# Patient Record
Sex: Female | Born: 1975 | Hispanic: Yes | Marital: Married | Smoking: Never smoker
Health system: Southern US, Community
[De-identification: ages and names within clinical notes are randomized; demographics above are authoritative.]

## PROBLEM LIST (undated history)

## (undated) ENCOUNTER — Inpatient Hospital Stay (HOSPITAL_COMMUNITY): Payer: Self-pay

## (undated) DIAGNOSIS — G43909 Migraine, unspecified, not intractable, without status migrainosus: Secondary | ICD-10-CM

## (undated) DIAGNOSIS — I10 Essential (primary) hypertension: Secondary | ICD-10-CM

## (undated) DIAGNOSIS — R87619 Unspecified abnormal cytological findings in specimens from cervix uteri: Secondary | ICD-10-CM

## (undated) DIAGNOSIS — IMO0002 Reserved for concepts with insufficient information to code with codable children: Secondary | ICD-10-CM

## (undated) HISTORY — PX: NO PAST SURGERIES: SHX2092

## (undated) HISTORY — DX: Unspecified abnormal cytological findings in specimens from cervix uteri: R87.619

## (undated) HISTORY — DX: Reserved for concepts with insufficient information to code with codable children: IMO0002

## (undated) HISTORY — DX: Migraine, unspecified, not intractable, without status migrainosus: G43.909

---

## 2000-10-31 ENCOUNTER — Encounter: Payer: Self-pay | Admitting: Emergency Medicine

## 2000-10-31 ENCOUNTER — Emergency Department (HOSPITAL_COMMUNITY): Admission: EM | Admit: 2000-10-31 | Discharge: 2000-10-31 | Payer: Self-pay | Admitting: Emergency Medicine

## 2001-07-02 ENCOUNTER — Ambulatory Visit (HOSPITAL_COMMUNITY): Admission: RE | Admit: 2001-07-02 | Discharge: 2001-07-02 | Payer: Self-pay | Admitting: *Deleted

## 2001-09-25 ENCOUNTER — Encounter: Admission: RE | Admit: 2001-09-25 | Discharge: 2001-09-25 | Payer: Self-pay | Admitting: Obstetrics and Gynecology

## 2001-09-30 ENCOUNTER — Ambulatory Visit: Admission: RE | Admit: 2001-09-30 | Discharge: 2001-09-30 | Payer: Self-pay | Admitting: Gynecologic Oncology

## 2001-10-20 ENCOUNTER — Inpatient Hospital Stay (HOSPITAL_COMMUNITY): Admission: AD | Admit: 2001-10-20 | Discharge: 2001-10-20 | Payer: Self-pay

## 2001-10-30 ENCOUNTER — Inpatient Hospital Stay (HOSPITAL_COMMUNITY): Admission: RE | Admit: 2001-10-30 | Discharge: 2001-10-30 | Payer: Self-pay | Admitting: *Deleted

## 2001-11-03 ENCOUNTER — Encounter (HOSPITAL_COMMUNITY): Admission: RE | Admit: 2001-11-03 | Discharge: 2001-11-17 | Payer: Self-pay | Admitting: *Deleted

## 2001-11-14 ENCOUNTER — Other Ambulatory Visit: Admission: RE | Admit: 2001-11-14 | Discharge: 2001-11-14 | Payer: Self-pay | Admitting: *Deleted

## 2001-11-17 ENCOUNTER — Encounter (INDEPENDENT_AMBULATORY_CARE_PROVIDER_SITE_OTHER): Payer: Self-pay | Admitting: *Deleted

## 2001-11-17 ENCOUNTER — Inpatient Hospital Stay (HOSPITAL_COMMUNITY): Admission: AD | Admit: 2001-11-17 | Discharge: 2001-11-20 | Payer: Self-pay | Admitting: *Deleted

## 2002-06-27 ENCOUNTER — Emergency Department (HOSPITAL_COMMUNITY): Admission: EM | Admit: 2002-06-27 | Discharge: 2002-06-27 | Payer: Self-pay | Admitting: Emergency Medicine

## 2002-10-27 ENCOUNTER — Emergency Department (HOSPITAL_COMMUNITY): Admission: EM | Admit: 2002-10-27 | Discharge: 2002-10-27 | Payer: Self-pay | Admitting: Emergency Medicine

## 2003-01-09 ENCOUNTER — Emergency Department (HOSPITAL_COMMUNITY): Admission: EM | Admit: 2003-01-09 | Discharge: 2003-01-09 | Payer: Self-pay | Admitting: Emergency Medicine

## 2003-03-30 ENCOUNTER — Other Ambulatory Visit: Admission: RE | Admit: 2003-03-30 | Discharge: 2003-03-30 | Payer: Self-pay | Admitting: *Deleted

## 2003-03-30 ENCOUNTER — Encounter (INDEPENDENT_AMBULATORY_CARE_PROVIDER_SITE_OTHER): Payer: Self-pay | Admitting: *Deleted

## 2003-03-30 ENCOUNTER — Encounter: Admission: RE | Admit: 2003-03-30 | Discharge: 2003-03-30 | Payer: Self-pay | Admitting: Obstetrics and Gynecology

## 2003-04-20 ENCOUNTER — Encounter: Admission: RE | Admit: 2003-04-20 | Discharge: 2003-04-20 | Payer: Self-pay | Admitting: Obstetrics and Gynecology

## 2003-05-08 HISTORY — PX: LEEP: SHX91

## 2003-08-15 ENCOUNTER — Emergency Department (HOSPITAL_COMMUNITY): Admission: EM | Admit: 2003-08-15 | Discharge: 2003-08-15 | Payer: Self-pay | Admitting: Emergency Medicine

## 2003-09-07 ENCOUNTER — Encounter (INDEPENDENT_AMBULATORY_CARE_PROVIDER_SITE_OTHER): Payer: Self-pay | Admitting: *Deleted

## 2003-09-07 ENCOUNTER — Encounter: Admission: RE | Admit: 2003-09-07 | Discharge: 2003-09-07 | Payer: Self-pay | Admitting: Obstetrics and Gynecology

## 2003-09-07 ENCOUNTER — Other Ambulatory Visit: Admission: RE | Admit: 2003-09-07 | Discharge: 2003-09-07 | Payer: Self-pay | Admitting: Obstetrics and Gynecology

## 2003-10-05 ENCOUNTER — Encounter: Admission: RE | Admit: 2003-10-05 | Discharge: 2003-10-05 | Payer: Self-pay | Admitting: Obstetrics and Gynecology

## 2003-12-07 ENCOUNTER — Encounter: Admission: RE | Admit: 2003-12-07 | Discharge: 2003-12-07 | Payer: Self-pay | Admitting: Obstetrics and Gynecology

## 2003-12-07 ENCOUNTER — Other Ambulatory Visit: Admission: RE | Admit: 2003-12-07 | Discharge: 2003-12-07 | Payer: Self-pay | Admitting: Obstetrics and Gynecology

## 2003-12-07 ENCOUNTER — Encounter (INDEPENDENT_AMBULATORY_CARE_PROVIDER_SITE_OTHER): Payer: Self-pay | Admitting: Specialist

## 2003-12-13 ENCOUNTER — Inpatient Hospital Stay (HOSPITAL_COMMUNITY): Admission: AD | Admit: 2003-12-13 | Discharge: 2003-12-13 | Payer: Self-pay | Admitting: Obstetrics and Gynecology

## 2003-12-21 ENCOUNTER — Encounter: Admission: RE | Admit: 2003-12-21 | Discharge: 2003-12-21 | Payer: Self-pay | Admitting: Obstetrics and Gynecology

## 2004-01-02 ENCOUNTER — Emergency Department (HOSPITAL_COMMUNITY): Admission: EM | Admit: 2004-01-02 | Discharge: 2004-01-02 | Payer: Self-pay | Admitting: *Deleted

## 2004-02-20 ENCOUNTER — Emergency Department (HOSPITAL_COMMUNITY): Admission: EM | Admit: 2004-02-20 | Discharge: 2004-02-20 | Payer: Self-pay | Admitting: Emergency Medicine

## 2004-04-06 ENCOUNTER — Ambulatory Visit: Payer: Self-pay | Admitting: Family Medicine

## 2004-04-09 ENCOUNTER — Emergency Department (HOSPITAL_COMMUNITY): Admission: EM | Admit: 2004-04-09 | Discharge: 2004-04-09 | Payer: Self-pay | Admitting: Emergency Medicine

## 2004-07-05 ENCOUNTER — Emergency Department (HOSPITAL_COMMUNITY): Admission: EM | Admit: 2004-07-05 | Discharge: 2004-07-05 | Payer: Self-pay | Admitting: Emergency Medicine

## 2004-08-10 ENCOUNTER — Ambulatory Visit: Payer: Self-pay | Admitting: Family Medicine

## 2004-08-10 ENCOUNTER — Encounter: Payer: Self-pay | Admitting: Family Medicine

## 2004-12-14 ENCOUNTER — Ambulatory Visit: Payer: Self-pay | Admitting: Obstetrics and Gynecology

## 2005-08-31 ENCOUNTER — Encounter (INDEPENDENT_AMBULATORY_CARE_PROVIDER_SITE_OTHER): Payer: Self-pay | Admitting: Gynecology

## 2005-08-31 ENCOUNTER — Ambulatory Visit: Payer: Self-pay | Admitting: Gynecology

## 2005-09-22 ENCOUNTER — Emergency Department (HOSPITAL_COMMUNITY): Admission: EM | Admit: 2005-09-22 | Discharge: 2005-09-22 | Payer: Self-pay | Admitting: Emergency Medicine

## 2006-08-21 ENCOUNTER — Encounter: Payer: Self-pay | Admitting: Obstetrics and Gynecology

## 2006-08-21 ENCOUNTER — Ambulatory Visit: Payer: Self-pay | Admitting: Obstetrics and Gynecology

## 2007-02-15 ENCOUNTER — Emergency Department (HOSPITAL_COMMUNITY): Admission: EM | Admit: 2007-02-15 | Discharge: 2007-02-16 | Payer: Self-pay | Admitting: Emergency Medicine

## 2007-08-22 ENCOUNTER — Ambulatory Visit: Payer: Self-pay | Admitting: Obstetrics & Gynecology

## 2007-08-22 ENCOUNTER — Encounter: Payer: Self-pay | Admitting: Obstetrics & Gynecology

## 2007-12-03 ENCOUNTER — Ambulatory Visit: Payer: Self-pay | Admitting: *Deleted

## 2007-12-03 ENCOUNTER — Ambulatory Visit (HOSPITAL_COMMUNITY): Admission: RE | Admit: 2007-12-03 | Discharge: 2007-12-03 | Payer: Self-pay | Admitting: Obstetrics & Gynecology

## 2007-12-22 ENCOUNTER — Inpatient Hospital Stay (HOSPITAL_COMMUNITY): Admission: AD | Admit: 2007-12-22 | Discharge: 2007-12-22 | Payer: Self-pay | Admitting: Family Medicine

## 2007-12-22 ENCOUNTER — Ambulatory Visit: Payer: Self-pay | Admitting: Family Medicine

## 2007-12-30 ENCOUNTER — Emergency Department (HOSPITAL_COMMUNITY): Admission: EM | Admit: 2007-12-30 | Discharge: 2007-12-30 | Payer: Self-pay | Admitting: Emergency Medicine

## 2007-12-31 ENCOUNTER — Ambulatory Visit: Payer: Self-pay | Admitting: Obstetrics & Gynecology

## 2008-01-08 ENCOUNTER — Ambulatory Visit: Payer: Self-pay | Admitting: Nurse Practitioner

## 2008-01-14 ENCOUNTER — Ambulatory Visit (HOSPITAL_COMMUNITY): Admission: RE | Admit: 2008-01-14 | Discharge: 2008-01-14 | Payer: Self-pay | Admitting: Obstetrics and Gynecology

## 2008-01-20 ENCOUNTER — Ambulatory Visit (HOSPITAL_COMMUNITY): Admission: RE | Admit: 2008-01-20 | Discharge: 2008-01-20 | Payer: Self-pay | Admitting: Family Medicine

## 2008-01-28 ENCOUNTER — Ambulatory Visit: Payer: Self-pay | Admitting: Obstetrics & Gynecology

## 2008-02-10 ENCOUNTER — Ambulatory Visit (HOSPITAL_COMMUNITY): Admission: RE | Admit: 2008-02-10 | Discharge: 2008-02-10 | Payer: Self-pay | Admitting: Family Medicine

## 2008-02-25 ENCOUNTER — Ambulatory Visit: Payer: Self-pay | Admitting: Obstetrics & Gynecology

## 2008-02-28 ENCOUNTER — Inpatient Hospital Stay (HOSPITAL_COMMUNITY): Admission: AD | Admit: 2008-02-28 | Discharge: 2008-02-28 | Payer: Self-pay | Admitting: Family Medicine

## 2008-03-02 ENCOUNTER — Ambulatory Visit (HOSPITAL_COMMUNITY): Admission: RE | Admit: 2008-03-02 | Discharge: 2008-03-02 | Payer: Self-pay | Admitting: Family Medicine

## 2008-03-19 ENCOUNTER — Ambulatory Visit (HOSPITAL_COMMUNITY): Admission: RE | Admit: 2008-03-19 | Discharge: 2008-03-19 | Payer: Self-pay | Admitting: Family Medicine

## 2008-03-24 ENCOUNTER — Ambulatory Visit: Payer: Self-pay | Admitting: Obstetrics & Gynecology

## 2008-04-13 ENCOUNTER — Ambulatory Visit: Payer: Self-pay | Admitting: Obstetrics and Gynecology

## 2008-04-13 ENCOUNTER — Inpatient Hospital Stay (HOSPITAL_COMMUNITY): Admission: AD | Admit: 2008-04-13 | Discharge: 2008-04-13 | Payer: Self-pay | Admitting: Family Medicine

## 2008-04-21 ENCOUNTER — Ambulatory Visit: Payer: Self-pay | Admitting: Obstetrics & Gynecology

## 2008-05-05 ENCOUNTER — Ambulatory Visit: Payer: Self-pay | Admitting: Obstetrics & Gynecology

## 2008-05-05 ENCOUNTER — Encounter: Payer: Self-pay | Admitting: Obstetrics & Gynecology

## 2008-05-05 LAB — CONVERTED CEMR LAB
HCT: 34.9 % — ABNORMAL LOW (ref 36.0–46.0)
Hemoglobin: 11.5 g/dL — ABNORMAL LOW (ref 12.0–15.0)
Platelets: 234 10*3/uL (ref 150–400)
WBC: 6.9 10*3/uL (ref 4.0–10.5)

## 2008-05-26 ENCOUNTER — Ambulatory Visit: Payer: Self-pay | Admitting: Family Medicine

## 2008-05-31 ENCOUNTER — Ambulatory Visit (HOSPITAL_COMMUNITY): Admission: RE | Admit: 2008-05-31 | Discharge: 2008-05-31 | Payer: Self-pay | Admitting: Family Medicine

## 2008-06-02 ENCOUNTER — Inpatient Hospital Stay (HOSPITAL_COMMUNITY): Admission: AD | Admit: 2008-06-02 | Discharge: 2008-06-02 | Payer: Self-pay | Admitting: Family Medicine

## 2008-06-09 ENCOUNTER — Ambulatory Visit: Payer: Self-pay | Admitting: Family Medicine

## 2008-06-23 ENCOUNTER — Ambulatory Visit: Payer: Self-pay | Admitting: Obstetrics & Gynecology

## 2008-06-30 ENCOUNTER — Ambulatory Visit: Payer: Self-pay | Admitting: Family Medicine

## 2008-07-01 ENCOUNTER — Encounter: Payer: Self-pay | Admitting: Obstetrics & Gynecology

## 2008-07-01 LAB — CONVERTED CEMR LAB: GC Probe Amp, Genital: NEGATIVE

## 2008-07-07 ENCOUNTER — Ambulatory Visit: Payer: Self-pay | Admitting: Obstetrics & Gynecology

## 2008-07-10 ENCOUNTER — Inpatient Hospital Stay (HOSPITAL_COMMUNITY): Admission: AD | Admit: 2008-07-10 | Discharge: 2008-07-12 | Payer: Self-pay | Admitting: Obstetrics and Gynecology

## 2008-07-10 ENCOUNTER — Encounter: Payer: Self-pay | Admitting: Obstetrics and Gynecology

## 2008-07-10 ENCOUNTER — Ambulatory Visit: Payer: Self-pay | Admitting: Advanced Practice Midwife

## 2008-07-16 ENCOUNTER — Ambulatory Visit: Payer: Self-pay | Admitting: Family Medicine

## 2008-07-16 ENCOUNTER — Inpatient Hospital Stay (HOSPITAL_COMMUNITY): Admission: AD | Admit: 2008-07-16 | Discharge: 2008-07-16 | Payer: Self-pay | Admitting: Obstetrics & Gynecology

## 2008-07-17 ENCOUNTER — Ambulatory Visit: Payer: Self-pay | Admitting: Obstetrics and Gynecology

## 2008-07-17 ENCOUNTER — Inpatient Hospital Stay (HOSPITAL_COMMUNITY): Admission: AD | Admit: 2008-07-17 | Discharge: 2008-07-17 | Payer: Self-pay | Admitting: Obstetrics & Gynecology

## 2008-11-29 ENCOUNTER — Emergency Department (HOSPITAL_COMMUNITY): Admission: EM | Admit: 2008-11-29 | Discharge: 2008-11-29 | Payer: Self-pay | Admitting: Internal Medicine

## 2009-04-12 ENCOUNTER — Emergency Department (HOSPITAL_COMMUNITY): Admission: EM | Admit: 2009-04-12 | Discharge: 2009-04-13 | Payer: Self-pay | Admitting: Emergency Medicine

## 2009-10-30 IMAGING — US US OB FOLLOW-UP
1 series · 14 of 28 positions shown · non-contrast
Comparison: none

OBSTETRICAL ULTRASOUND:
 This ultrasound exam was performed in the [HOSPITAL] Ultrasound Department.  The OB US report was generated in the AS system, and faxed to the ordering physician.  This report is also available in [REDACTED] PACS.

[Series 1: us ob follow up / re-eval · 14 of 28 slices shown]
[im 2/28]
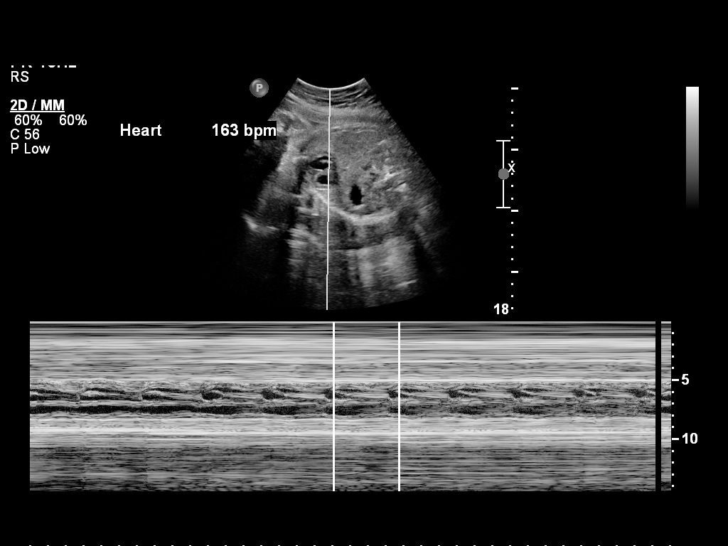
[im 4/28]
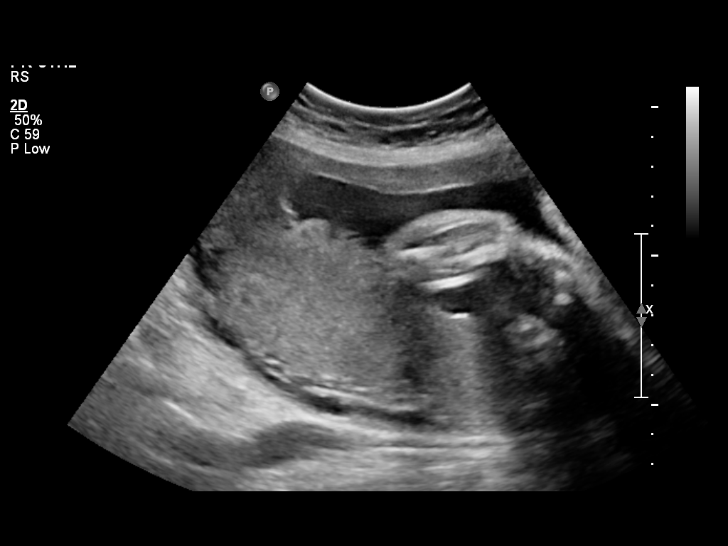
[im 6/28]
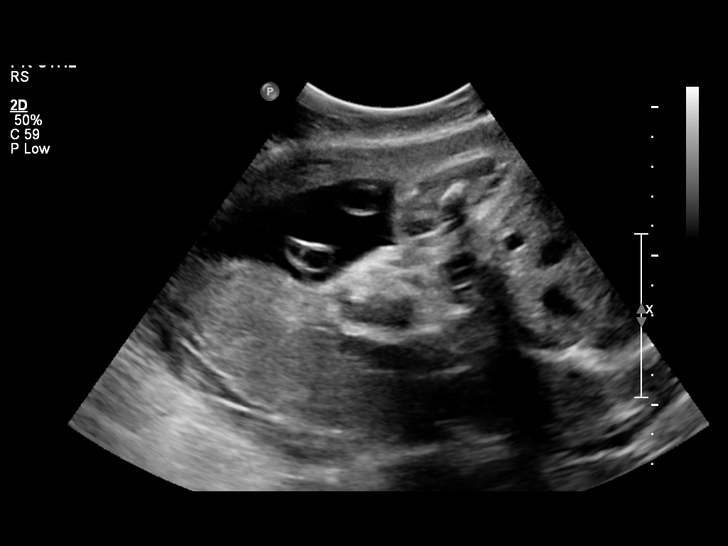
[im 8/28]
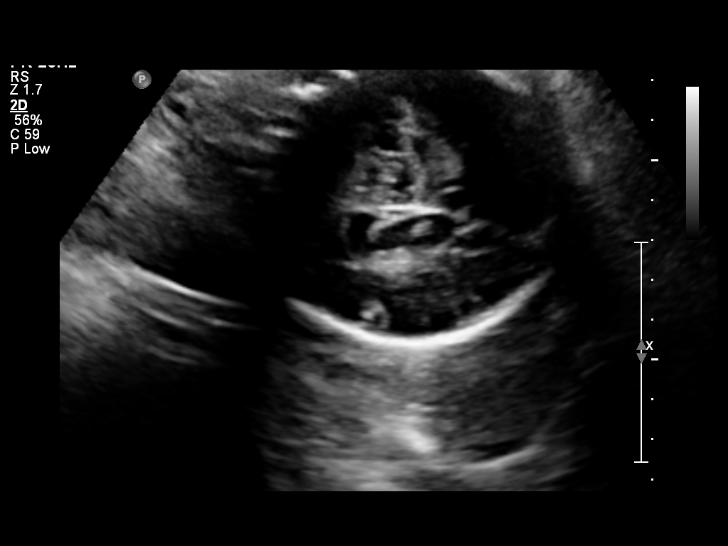
[im 10/28]
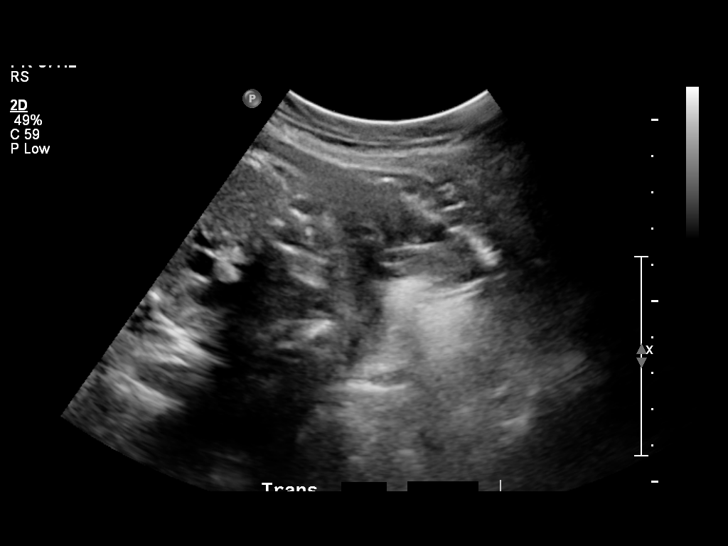
[im 12/28]
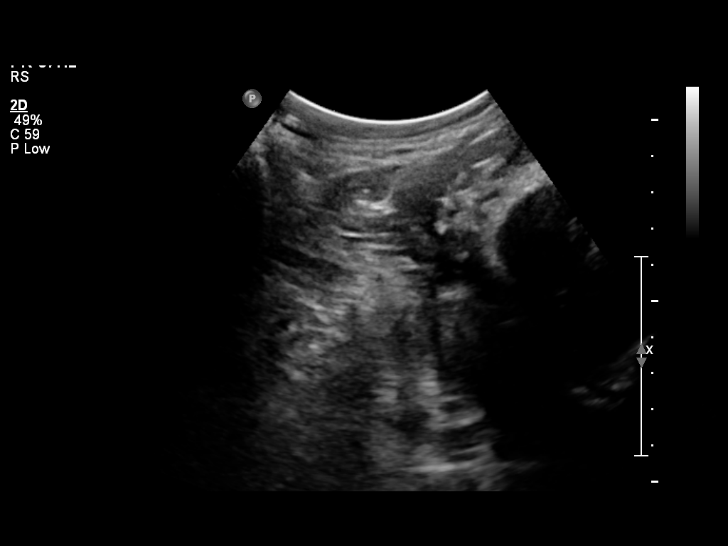
[im 14/28]
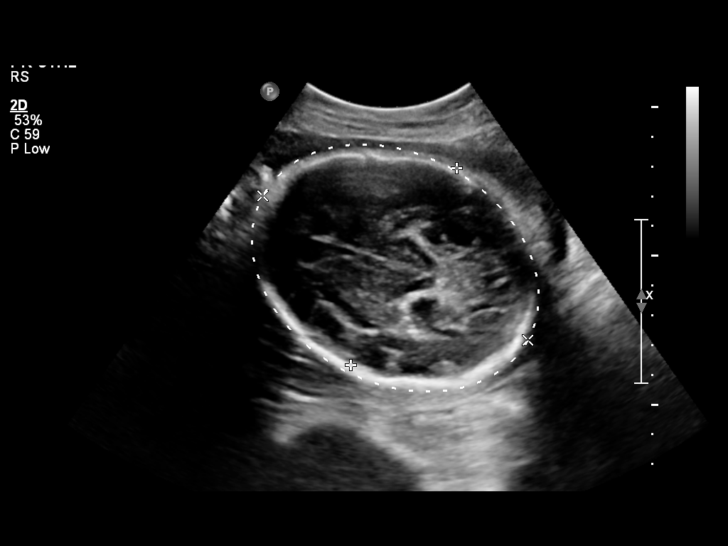
[im 16/28]
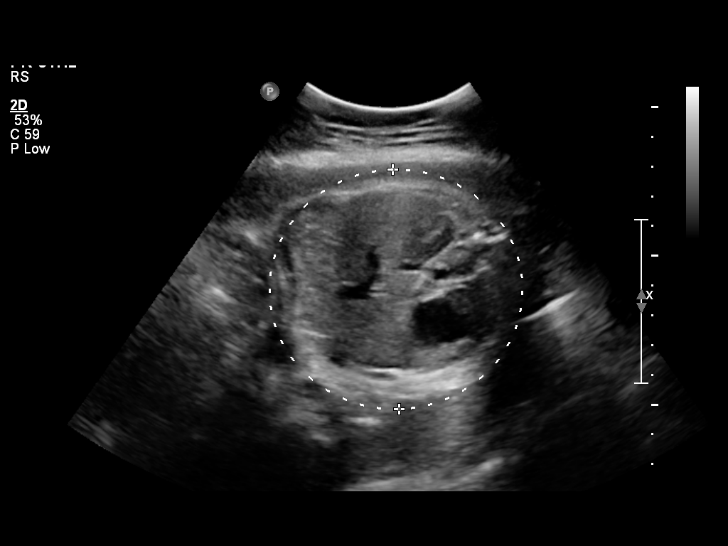
[im 18/28]
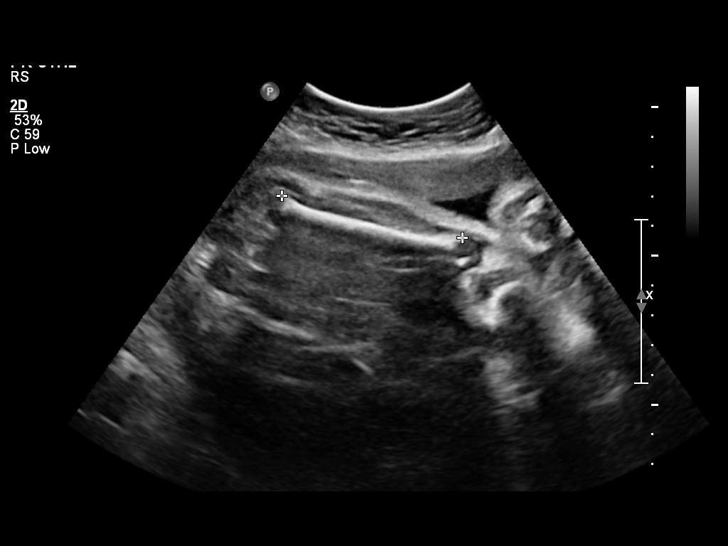
[im 20/28]
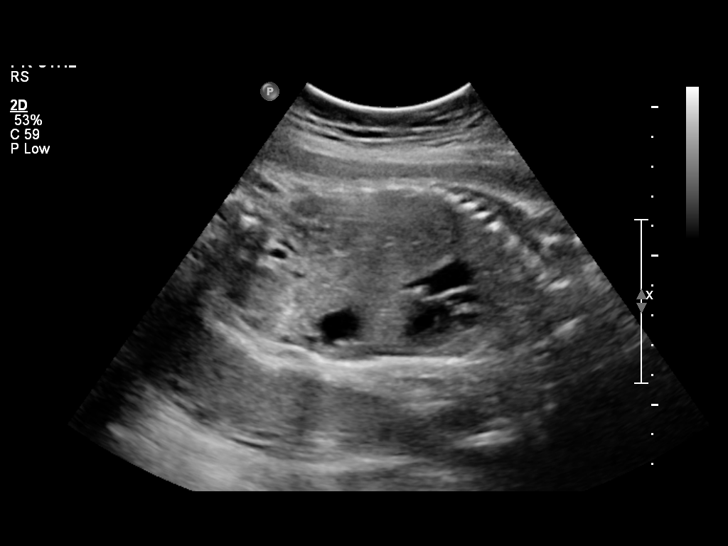
[im 22/28]
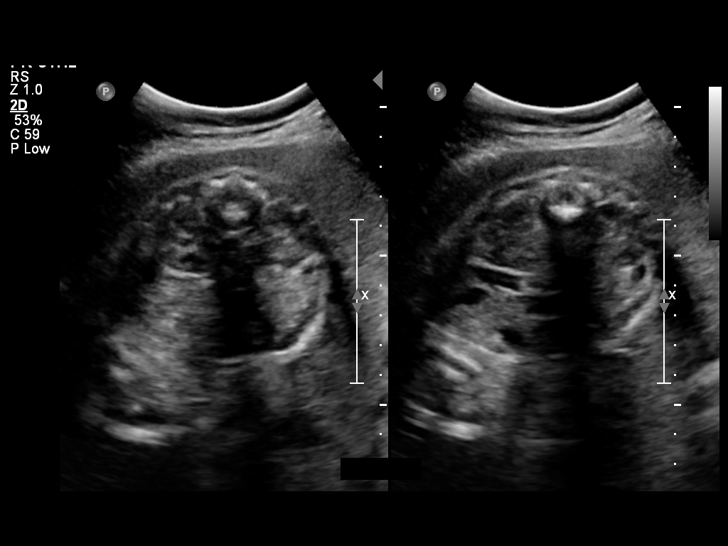
[im 24/28]
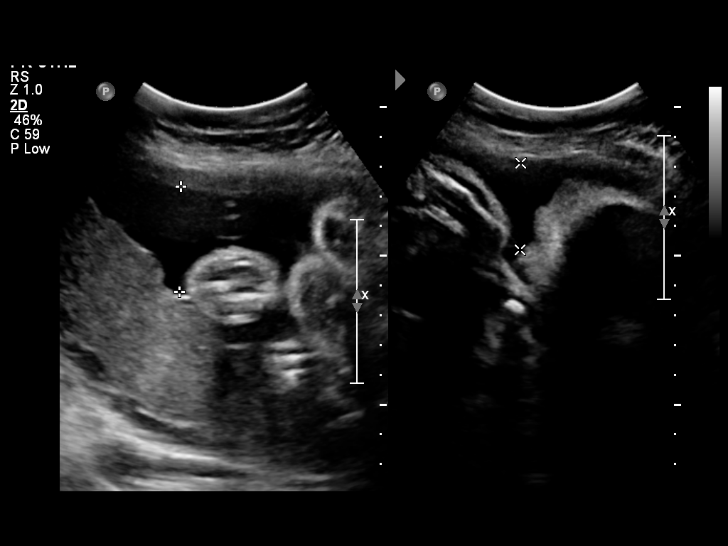
[im 26/28]
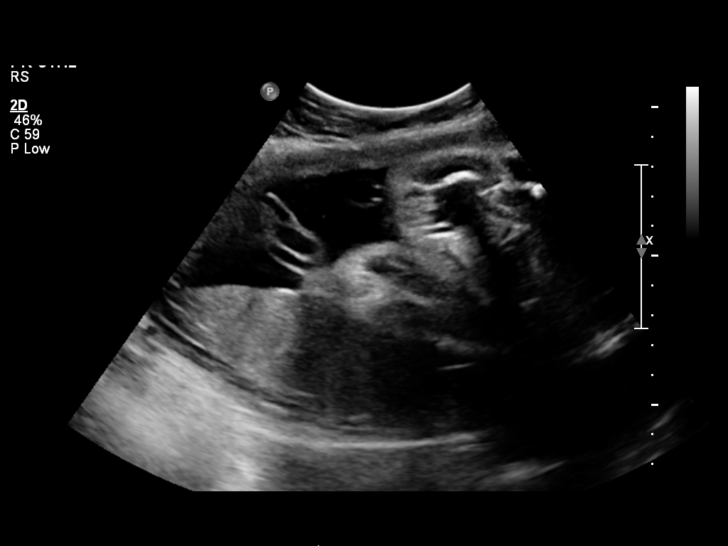
[im 28/28]
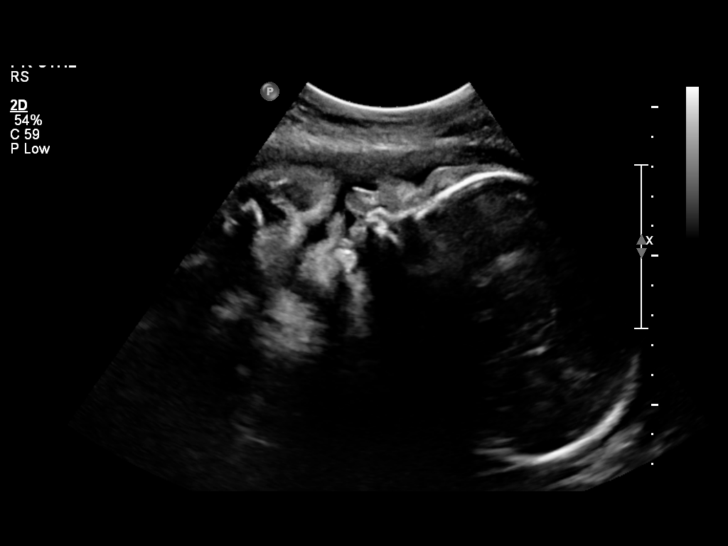

[14 of 28 positions shown; findings below may reference images not displayed]

IMPRESSION: See AS Obstetric US report.

## 2009-12-16 ENCOUNTER — Emergency Department (HOSPITAL_COMMUNITY): Admission: EM | Admit: 2009-12-16 | Discharge: 2009-12-17 | Payer: Self-pay | Admitting: Emergency Medicine

## 2010-08-08 LAB — URINALYSIS, ROUTINE W REFLEX MICROSCOPIC
Ketones, ur: NEGATIVE mg/dL
Nitrite: NEGATIVE
Specific Gravity, Urine: 1.011 (ref 1.005–1.030)

## 2010-08-08 LAB — URINE CULTURE
Colony Count: NO GROWTH
Culture: NO GROWTH

## 2010-08-08 LAB — CBC
HCT: 37.3 % (ref 36.0–46.0)
Hemoglobin: 12.4 g/dL (ref 12.0–15.0)
Platelets: 223 10*3/uL (ref 150–400)
RDW: 12.6 % (ref 11.5–15.5)

## 2010-08-08 LAB — CULTURE, BLOOD (ROUTINE X 2): Culture: NO GROWTH

## 2010-08-08 LAB — COMPREHENSIVE METABOLIC PANEL
AST: 19 U/L (ref 0–37)
Albumin: 3.3 g/dL — ABNORMAL LOW (ref 3.5–5.2)
GFR calc Af Amer: >60 mL/min (ref >60)
GFR calc non Af Amer: >60 mL/min (ref >60)
Potassium: 3.2 mEq/L — ABNORMAL LOW (ref 3.5–5.1)
Total Bilirubin: 0.2 mg/dL — ABNORMAL LOW (ref 0.3–1.2)

## 2010-08-08 LAB — DIFFERENTIAL
Eosinophils Absolute: 0 10*3/uL (ref 0.0–0.7)
Eosinophils Relative: 0 % (ref 0–5)
Lymphs Abs: 0.8 10*3/uL (ref 0.7–4.0)
Monocytes Absolute: 0.3 10*3/uL (ref 0.1–1.0)
Monocytes Relative: 4 % (ref 3–12)
Neutro Abs: 6 10*3/uL (ref 1.7–7.7)
Neutrophils Relative %: 85 % — ABNORMAL HIGH (ref 43–77)

## 2010-08-17 LAB — POCT URINALYSIS DIP (DEVICE)
Glucose, UA: NEGATIVE mg/dL
Nitrite: NEGATIVE
Protein, ur: NEGATIVE mg/dL
Specific Gravity, Urine: 1.02 (ref 1.005–1.030)
Urobilinogen, UA: 0.2 mg/dL (ref 0.0–1.0)

## 2010-08-17 LAB — BASIC METABOLIC PANEL
BUN: 9 mg/dL (ref 6–23)
Creatinine, Ser: 0.61 mg/dL (ref 0.4–1.2)
GFR calc Af Amer: >60 mL/min (ref >60)
GFR calc non Af Amer: >60 mL/min (ref >60)
Potassium: 3.9 mEq/L (ref 3.5–5.1)

## 2010-08-17 LAB — CBC
MCV: 95.5 fL (ref 78.0–100.0)
Platelets: 250 10*3/uL (ref 150–400)
Platelets: 290 10*3/uL (ref 150–400)
RBC: 3.6 MIL/uL — ABNORMAL LOW (ref 3.87–5.11)
WBC: 3.7 10*3/uL — ABNORMAL LOW (ref 4.0–10.5)
WBC: 9.9 10*3/uL (ref 4.0–10.5)

## 2010-08-17 LAB — DIFFERENTIAL
Basophils Absolute: 0 10*3/uL (ref 0.0–0.1)
Basophils Relative: 0 % (ref 0–1)
Eosinophils Absolute: 0 10*3/uL (ref 0.0–0.7)
Eosinophils Relative: 1 % (ref 0–5)
Lymphocytes Relative: 8 % — ABNORMAL LOW (ref 12–46)
Lymphs Abs: 0.3 10*3/uL — ABNORMAL LOW (ref 0.7–4.0)
Neutrophils Relative %: 82 % — ABNORMAL HIGH (ref 43–77)

## 2010-08-17 LAB — URINALYSIS, ROUTINE W REFLEX MICROSCOPIC
Bilirubin Urine: NEGATIVE
Ketones, ur: NEGATIVE mg/dL
Nitrite: NEGATIVE
Protein, ur: NEGATIVE mg/dL
Urobilinogen, UA: 0.2 mg/dL (ref 0.0–1.0)

## 2010-08-17 LAB — RPR: RPR Ser Ql: NONREACTIVE

## 2010-08-21 LAB — FETAL FIBRONECTIN: Fetal Fibronectin: NEGATIVE

## 2010-08-21 LAB — WET PREP, GENITAL
Trich, Wet Prep: NONE SEEN
Yeast Wet Prep HPF POC: NONE SEEN

## 2010-08-21 LAB — DIFFERENTIAL
Eosinophils Absolute: 0.1 10*3/uL (ref 0.0–0.7)
Eosinophils Relative: 1 % (ref 0–5)
Lymphocytes Relative: 18 % (ref 12–46)
Lymphs Abs: 1.1 10*3/uL (ref 0.7–4.0)
Monocytes Absolute: 0.4 10*3/uL (ref 0.1–1.0)

## 2010-08-21 LAB — URINALYSIS, ROUTINE W REFLEX MICROSCOPIC
Glucose, UA: NEGATIVE mg/dL
Hgb urine dipstick: NEGATIVE
Ketones, ur: NEGATIVE mg/dL
Protein, ur: NEGATIVE mg/dL
pH: 5.5 (ref 5.0–8.0)

## 2010-08-21 LAB — POCT URINALYSIS DIP (DEVICE)
Hgb urine dipstick: NEGATIVE
Ketones, ur: NEGATIVE mg/dL
Protein, ur: 30 mg/dL — AB
Specific Gravity, Urine: 1.015 (ref 1.005–1.030)
Urobilinogen, UA: 0.2 mg/dL (ref 0.0–1.0)

## 2010-08-21 LAB — CBC
HCT: 33 % — ABNORMAL LOW (ref 36.0–46.0)
Hemoglobin: 11.3 g/dL — ABNORMAL LOW (ref 12.0–15.0)
MCV: 94.8 fL (ref 78.0–100.0)
Platelets: 217 10*3/uL (ref 150–400)
RDW: 13.2 % (ref 11.5–15.5)
WBC: 6 10*3/uL (ref 4.0–10.5)

## 2010-08-21 LAB — STREP B DNA PROBE

## 2010-08-22 LAB — POCT URINALYSIS DIP (DEVICE)
Bilirubin Urine: NEGATIVE
Glucose, UA: NEGATIVE mg/dL
Hgb urine dipstick: NEGATIVE
Hgb urine dipstick: NEGATIVE
Hgb urine dipstick: NEGATIVE
Ketones, ur: NEGATIVE mg/dL
Nitrite: NEGATIVE
Protein, ur: NEGATIVE mg/dL
Protein, ur: NEGATIVE mg/dL
Protein, ur: NEGATIVE mg/dL
Specific Gravity, Urine: 1.01 (ref 1.005–1.030)
Specific Gravity, Urine: 1.015 (ref 1.005–1.030)
Urobilinogen, UA: 0.2 mg/dL (ref 0.0–1.0)
Urobilinogen, UA: 0.2 mg/dL (ref 0.0–1.0)
pH: 7 (ref 5.0–8.0)
pH: 5.5 (ref 5.0–8.0)
pH: 8 (ref 5.0–8.0)

## 2010-09-19 NOTE — Group Therapy Note (Signed)
NAME:  [name], [name] NO.:  [id number]   MEDICAL RECORD NO.:  [id number]          PATIENT TYPE:  WOC   LOCATION[facility]                   FACILITY:  [facility]   PHYSICIAN:  Elsie Lincoln, MD      DATE OF BIRTH:  [birth year]   DATE OF SERVICE:  08/22/2007                                  CLINIC NOTE   The patient is a 35 year old gravida 1, para 1, female, LMP August 13, 2007, who presents for annual examination.  The patient has a history of  a LEEP for high grade back in 2005.  She has had normal Pap smears  since.  The patient uses condoms for birth control and would like to get  pregnant in the future.  We talked about going on prenatal vitamins  prior to becoming pregnant.   PAST MEDICAL HISTORY:  Palpitations on propranolol.   PAST SURGICAL HISTORY:  LEEP.   ALLERGIES:  No known drug allergies.   MEDICATIONS:  1. Advil p.r.n.  2. Propranolol.   REVIEW OF SYSTEMS:  Negative.   PHYSICAL EXAMINATION:  VITAL SIGNS:  Temperature 97.8, pulse 84, blood  pressure 107/73, weight 117.4 pounds.  GENERAL:  Well-nourished, well-developed, in no apparent distress.  HEENT:  Normocephalic and atraumatic.  LUNGS:  Clear to auscultation bilaterally.  HEART:  Regular rate and rhythm.  NECK:  Supple with no masses.  No thyromegaly.  BREASTS:  Nontender.  No masses, no nipple discharge, and no  lymphadenopathy.  ABDOMEN:  Soft, nontender, no organomegaly and no hernia.  GENITOURINARY:  Vagina pink normal rugae.  Cervix extremely limited  cervix present in the vaginal vault.  This is most likely secondary to  her LEEP.  Uterus anteverted and nontender.  Adnexa with no masses and  nontender.  RECTAL:  No hemorrhoids.  EXTREMITIES:  Nontender.   ASSESSMENT:  A 35 year old female with well woman examination.  1. Pap smear.  2. The patient has some periovulatory discomfort.  I did not feel any      ovarian cysts or masses, so explains that cramping on one side or  the other during her ovulation is normal.  3. Return to clinic in one year or sooner if she plans on becoming      pregnant.  4. The patient will need surveillance for her cervix given that she      has had this LEEP and there is very little cervix felt in the      vagina.           ______________________________  Elsie Lincoln, MD     KL/MEDQ  D:  08/22/2007  T:  08/22/2007  Job:  [id number]

## 2010-09-19 NOTE — Assessment & Plan Note (Signed)
[name], [name]    ACCOUNT NO.:  [id number]   MEDICAL RECORD NO.:  [id number]          PATIENT TYPE:  POB   LOCATION:  [facility]         FACILITY:  [facility]   PHYSICIAN:  Elsie Lincoln, MD      DATE OF BIRTH:  01/07/1976   DATE OF SERVICE:  01/08/2008                                  CLINIC NOTE   The patient comes to the office today for a consultation on her migraine  headaches.  This patient is currently 11 weeks and 4 days pregnant.  This was a planned and much anticipated pregnancy.  She had a 5-year  history of abnormal Pap smears.  She eventually had a LEEP procedure and  her HPV was cleared and she was given a go ahead in April 2009 to get  pregnant.  Her due date is July 25, 2008.  She does have a history of  migraines that were rare in the past.  Currently in this first trimester  of her pregnant, she has been having a migraine 1-3 times per week and  she states that they are all severe.  They can be either right-sided or  left-sided.  She gets nausea, vomiting and photophobia and phonophobia.  She has difficulty with movement and has to be in a dark room.  She has  been seen by the [facility]N doctors at [facility] and has been given  Flexeril, which helps her very little and Phenergan, which works very  well for her nausea.  The Flexeril makes her sleepy.  She does admit  that she has been having difficulty sleeping in this first trimester of  her pregnancy and she thinks that that may be contributing overall to  her headaches.   CURRENT MEDICATIONS:  Flexeril and Phenergan.   IMMUNIZATIONS:  Rubella, chickenpox, tetanus, flu and pneumonia.   OBSTETRICAL HISTORY:  She is a G2, P1.  She is currently again pregnant  [redacted] weeks and 4 days.   GYN HISTORY:  She has had a LEEP.  She has had abnormal Pap smears in  2003, 2004 and 2005.   SURGICAL HISTORY:  LEEP for HPV.   PERSONAL HISTORY:  She denies any other disease state including  arthritis,  kidney problems, tuberculosis and emphysema.   SOCIAL HISTORY:  The patient does not work outside of the home.  She  does not smoke.  She does not drink alcohol.  She does not drink  caffeinated beverages.   REVIEW OF SYSTEMS:  Positive for fatigue and weight gain, frequent  headaches, problems with vision, shortness of breath, nausea and  vomiting.   PHYSICAL EXAMINATION:  VITAL SIGNS:  Blood pressure is 109/66, pulse is  87, weight is 114.  HEENT:  Head is normocephalic and atraumatic.  Pupils are equal and  reactive to light.  NEUROLOGIC:  The patient is somewhat difficult to communicate with as  she is Hispanic, we did bring Rosalita Chessman and interpreted with good results.  The patient's answers are fluid and cohesive.  She has good muscle  strength and muscle coordination.  CARDIAC:  Regular rate and rhythm.  LUNGS:  Clear bilaterally.  ABDOMEN:  Fetal heart tones were heard.   ASSESSMENT:  Migraine in first trimester pregnancy.  PLAN:  The patient is reassured that her headaches will likely improve  as she moves into her second and third trimester pregnancy.  We have  decided that she will take the Flexeril 10 mg at bedtime every night to  help her with sleep.  Hopefully, this will improve her headaches.  She  is asking for a refill on her Phenergan today.  She will be given  Percocet 5-325 to take 1 every 4 hours as needed for migraines.  Hopefully, she will again need this less and less as time goes by.  She  is encouraged to take this on an as-needed basis only and she is very  concerned about over medicating herself and the baby and I think she  will be very reasonable with this.  She will follow up in 2 weeks.      Remonia Richter, NP    ______________________________  Elsie Lincoln, MD    LR/MEDQ  D:  01/08/2008  T:  01/09/2008  Job:  [id number]

## 2010-09-22 NOTE — Group Therapy Note (Signed)
NAME:  [name], [name] NO.:  [id number]   MEDICAL RECORD NO.:  [id number]                   PATIENT TYPE:  OUT   LOCATION[facility]                           FACILITY:  [facility]   PHYSICIAN:  Argentina Donovan, MD                     DATE OF BIRTH:  [birth year]   DATE OF SERVICE:  12/07/2003                                    CLINIC NOTE   The patient is a 35 year old gravida 1, para 1-0-0-1 who had a colposcopy  after [facility] that showed a high grade squamous  intraepithelial lesion on the external cervix with a normal endocervical  biopsy.  Underwent LEEP after the patient had seen the film and was able to  answer all the questions.   PROCEDURE:  With the patient in the dorsal lithotomy position an insulated  speculum was placed into the vagina so that the cervix was in the mid  portion and in four different areas 2, 4, 8, and 10 o'clock 1 mL of each  area of Xylocaine 1%, 1:100,000 epinephrine was used and an 8 x 20 mm loop  was used to complete the LEEP biopsy.  Unfortunately, it had to be done in  three stages because we did not start the blended current high enough, we  started it at 45 and it took to 60 to get a clean cut.  The area around the  biopsy site was then coagulated with a ball cautery as well as the areas  inside of the biopsy area to prevent bleeding.  Monsel solution was held  into the area and the speculums were removed.  The patient will return in  two weeks for evaluation and then four months for Pap smear.                                               Argentina Donovan, MD    PR/MEDQ  D:  12/07/2003  T:  12/08/2003  Job:  [id number]

## 2010-09-22 NOTE — Consult Note (Signed)
[facility]  Patient:    [name], [name] Visit Number: [id number] MRN: [id number]          Service Type: GON Location: GYN Attending Physician:  Sabino Donovan Dictated by:   Jackquline Denmark. Kyla Balzarine, M.D. Admit Date:  09/30/2001   CC:         Dr. Okey Dupre, [facility] Department,  Family Planning and Maternal Health Division  Telford Nab, R.N.   Consultation Report  REASON FOR CONSULTATION:  This 35 year old, nulligravida, Hispanic female is referred for consultation regarding the management of a high-grade SIL Pap smear during pregnancy.  HISTORY OF PRESENT ILLNESS:  (Obtained with the aid of a translator.)  The patient relates one sexual partner, her husband, after she married last year. 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  She had a Pap smear obtained on August 11, 2001, revealing atypical squamous cells and Pearlean Brownie high-grade squamous intraepithelial lesion.  She was found to have a cervix that was extremely friable.  Because of difficulty in colposcopy, she was referred for evaluation.  The patient denies spontaneous bleeding.  She has no other pelvic symptoms.  She states that the baby is currently quite active.  PAST MEDICAL HISTORY:  Significant for no major comorbidities or surgeries.  MEDICATIONS:  She is on no medications.  ALLERGIES:  She denies allergies.  PERSONAL AND SOCIAL HISTORY:  Married.  Denies tobacco.  Admits to occasional ethanol.  FAMILY HISTORY:  Noncontributory.  PHYSICAL EXAMINATION:  Weight 112 pounds.  VITAL SIGNS:  Stable and afebrile.  ABDOMEN:  Scaphoid, soft, and benign with fundal height approximately 32 cm. Positive fetal motion.  PELVIC:  External genitalia and BUS are normal to inspection and palpation without gross condylomatous lesions.  The vagina is clear and well supported. The cervix has normal  consistency.  Bimanual examination reveals a gravid uterus compatible with dates without adnexal or parametrial pathology.  PROCEDURE NOTE:  After verbal informed consent was obtained, colposcopy was performed of the upper vagina and cervix using dilute acetic acid.  There is a white lesion involving the circumferential squamocolumnar junction, extending into the outer portio of the cervix anteriorly from approximately 9 to 3 oclock with mosaicism and punctation, but no atypical vascularity or vascular lakes.  The upper limits of the lesion is incompletely visualized, but it appears not to extend above the squamocolumnar junction.  There are no portions suggestive invasive disease; biopsy not performed.  ASSESSMENT: 1. Cervical intraepithelial neoplasia. 2. Intrauterine pregnancy.  PLAN:  I had a discussion with the patient regarding the nature of her disease.  I recommended that she undergo follow-up colposcopy and biopsies by Dr. Okey Dupre after she has delivered, preferably six to eight weeks postpartum. At present, I do not believe that there are any indicators pushing [place] to a biopsy.  I would, however, repeat the cytology in a month if the patient has not delivered and would consider induction and further evaluation if she had a Pap smear suggesting carcinoma, but would not change management of pregnancy if she had a Pap smear suggesting dysplasia only. Dictated by:   Jackquline Denmark. Kyla Balzarine, M.D. Attending Physician:  Ronita Hipps T DD:  09/30/01 TD:  10/01/01 Job: [id number]

## 2010-09-22 NOTE — Group Therapy Note (Signed)
NAME:  [name], [name] NO.:  [id number]   MEDICAL RECORD NO.:  [id number]                   PATIENT TYPE:  OUT   LOCATION[facility]  [facility]                           FACILITY:  [facility]   PHYSICIAN:  Argentina Donovan, MD                     DATE OF BIRTH:  1975-12-17   DATE OF SERVICE:  09/07/2003                                    CLINIC NOTE   REASON FOR ADMISSION:  The patient is a 35 year old Hispanic female, gravida  1, para 1-0-0-1, who has had repeated atypical Pap smears with normal  biopsies on colposcopy.  She was re-colposcoped today because of high-risk  HPVNHSIL on her last Pap smear.   FINDINGS:  There were marked acetal white changes around most of the  transition zone, which is 360 degrees.  Endocervical curettage was carried  out.  At about 12 o'clock was also seen mosaicism, with a 7 o'clock obvious  area of punctation.   IMPRESSION:  CIN-2.  Biopsies taken at 12, 11 and 7 o'clock.  Pending  pathology report.                                               Argentina Donovan, MD    PR/MEDQ  D:  09/07/2003  T:  09/08/2003  Job:  [id number]

## 2010-09-22 NOTE — Group Therapy Note (Signed)
[name], [name] NO.:  [id number]   MEDICAL RECORD NO.:  [id number]          PATIENT TYPE:  WOC   [facility]ATION[facility]                   FACILITY:  [facility]   PHYSICIAN:  Tinnie Gens, MD        DATE OF BIRTH:  01-29-1976   DATE OF SERVICE:  04/06/2004                                    CLINIC NOTE   CHIEF COMPLAINT:  Follow-up Pap.   HISTORY OF PRESENT ILLNESS:  The patient is a 35 year old G1 P1 who is  status post a LEEP in August 2005.  She is here for a 54-month follow-up Pap  smear.   The patient complains today of some intermittent lower abdominal pain and  vaginal pain.  She is using condoms currently for birth control.  Her last  Depo-Provera was in August as well.  She has not had a period since then and  does not want to continue this for that reason.   PHYSICAL EXAMINATION TODAY:  VITAL SIGNS:  As noted in the chart.  PELVIC:  She has normal external female genitalia.  The vagina is rugated  and pink.  The cervix is scarred and not well demarcated from the vagina.  Pap smear is obtained easily.  The uterus is small, anteverted.  The adnexa  are without mass or tenderness.  On exam, the patient does have some point  tenderness to the cervix.   IMPRESSION:  1.  History of cervical intraepithelial neoplasia grade 2.  2.  Probable cervical pain from scarring.  3.  Amenorrhea, probably secondary to Depo-Provera.   PLAN:  Follow up in 4 months for another Pap.      TP/MEDQ  D:  04/06/2004  T:  04/06/2004  Job:  [id number]

## 2010-09-22 NOTE — Group Therapy Note (Signed)
NAMEMARYBELL, [name] NO.:  [id number]   MEDICAL RECORD NO.:  [id number]                   PATIENT TYPE:  OUT   [facility]ATION[facility]                           [facility]AC[facility]   PHYSICIAN:  Argentina Donovan, MD                     DATE OF BIRTH:  09-27-1975   DATE OF SERVICE:  04/20/2003                                    CLINIC NOTE   HISTORY OF PRESENT ILLNESS:  This patient is a gravida 1, para 1-62-91-68, 35  years old who was sent over from the health department for a colposcopy in  May of 2003 while she was still pregnant.  The cervix was so extremely  friable and difficult to evaluate because of the friability of it that she  was referred to oncology and apparently never arrived there.  Then, on her  routine visit recently at the [facility] she had an atypical Pap smear,  high grade SIN for which she came here and colposcopy was carried out and  the impression was probable dysplasia.  However, the pathology report came  back showing benign findings with marked acute cervicitis so the patient  came in today for possible treatment.  However, I think that over treatment  is a real risk in this patient.  Therefore, we repeated a Pap smear for HPV  typing and if it is abnormal I would bring her in for cryo surgery.  The  examination shows a marked ectropion.                                               Argentina Donovan, MD    PR/MEDQ  D:  04/20/2003  T:  04/20/2003  Job:  [id number]

## 2010-09-22 NOTE — Group Therapy Note (Signed)
NAMESHAKEITA, VANDEVANDER NO.:  [id number]   MEDICAL RECORD NO.:  [id number]          PATIENT TYPE:  WOC   [facility]                   [facility]ACILITY[facility]   PHYSICIAN:  Tinnie Gens, MD        DATE OF BIRTH:  29-Mar-1976   DATE OF SERVICE:  08/10/2004                                    CLINIC NOTE   CHIEF COMPLAINT:  Repeat Pap smear.   HISTORY OF PRESENT ILLNESS:  Patient is a 35 year old gravida 1, para 1 who  is status post LEEP in August of 2005.  Had a normal Pap smear in December  and is here for her next four-month follow-up.  Patient is without  complaints.  Today she continues on her Depo and does not have cycles.   PHYSICAL EXAMINATION:  VITAL SIGNS:  As noted in the chart.  GENERAL:  She is a well-developed, well-nourished white female in no acute  distress.  ABDOMEN:  Soft, nontender, nondistended.  GENITOURINARY:  She has normal external female genitalia.  Vagina is pink  and rugated.  The cervix is somewhat flattened to the vaginal wall, but is  otherwise well healed.  Pap smear is obtained easily.   IMPRESSION:  History of abnormal Pap CIN II status post LEEP August 2005.  Previous normal Pap smear December 2005.   PLAN:  1.  Pap smear today.  2.  Follow-up another Pap in four months.  If both of those are normal she      will be back to q.6 month Paps for one year, then yearly Paps.      TP/MEDQ  D:  08/10/2004  T:  08/10/2004  Job:  [id number]

## 2010-09-22 NOTE — Group Therapy Note (Signed)
NAME:  [name], [name] NO.:  [id number]   MEDICAL RECORD NO.:  [id number]          PATIENT TYPE:  WOC   [facility]                   [facility]ACILITY:  [facility]   PHYSICIAN:  Argentina Donovan, MD        DATE OF BIRTH:  June 10, 1975   DATE OF SERVICE:  08/21/2006                                  CLINIC NOTE   The patient is a 29-year gravida 1, para 1-0-0-1, history of dysplasia  treated with a LEEP, has a child 35 years old, is trying to get pregnant  thus starting on folic acid, in for annual Pap smear.   PHYSICAL EXAMINATION:  External genitalia is normal with BUS within  normal limits.  The vagina is clean and well rugated.  The cervix is  clean and well epithelialized, parous.  Pap smear was taken.  The uterus  was anterior, normal size, shape, consistency and normal adnexa.  The  patient asked about mammogram, I told her she was too young to get one.  She has no family history and will start getting them at the age of 42.           ______________________________  Argentina Donovan, MD     PR/MEDQ  D:  08/21/2006  T:  08/21/2006  Job:  [id number]

## 2011-02-02 LAB — POCT URINALYSIS DIP (DEVICE)
Bilirubin Urine: NEGATIVE
Hgb urine dipstick: NEGATIVE
Nitrite: NEGATIVE
Protein, ur: NEGATIVE
pH: 6.5

## 2011-02-05 LAB — COMPREHENSIVE METABOLIC PANEL
ALT: 18
AST: 18
Calcium: 9
Creatinine, Ser: 0.45
GFR calc Af Amer: >60
Sodium: 136
Total Protein: 6.6

## 2011-02-05 LAB — DIFFERENTIAL
Eosinophils Absolute: 0.1
Eosinophils Relative: 1
Lymphocytes Relative: 24
Lymphs Abs: 1.6
Monocytes Relative: 5
Neutrophils Relative %: 70

## 2011-02-05 LAB — CBC
MCHC: 33.5
RDW: 13.8

## 2011-02-05 LAB — URINALYSIS, ROUTINE W REFLEX MICROSCOPIC
Bilirubin Urine: NEGATIVE
Glucose, UA: NEGATIVE
Hgb urine dipstick: NEGATIVE
Ketones, ur: 15 — AB
Nitrite: NEGATIVE
Specific Gravity, Urine: 1.01
pH: 5.5

## 2011-02-05 LAB — URINE CULTURE
Colony Count: NO GROWTH
Culture: NO GROWTH

## 2011-02-05 LAB — GC/CHLAMYDIA PROBE AMP, GENITAL
Chlamydia, DNA Probe: NEGATIVE
GC Probe Amp, Genital: NEGATIVE

## 2011-02-05 LAB — POCT URINALYSIS DIP (DEVICE)
Bilirubin Urine: NEGATIVE
Glucose, UA: NEGATIVE
Glucose, UA: NEGATIVE
Ketones, ur: NEGATIVE
Ketones, ur: NEGATIVE
Operator id: 15968
Operator id: 287931
pH: 6.5

## 2011-02-05 LAB — WET PREP, GENITAL
WBC, Wet Prep HPF POC: NONE SEEN
Yeast Wet Prep HPF POC: NONE SEEN

## 2011-02-06 LAB — POCT URINALYSIS DIP (DEVICE)
Bilirubin Urine: NEGATIVE
Protein, ur: NEGATIVE
Specific Gravity, Urine: 1.02
pH: 7

## 2011-02-08 LAB — POCT URINALYSIS DIP (DEVICE)
Bilirubin Urine: NEGATIVE
Bilirubin Urine: NEGATIVE
Hgb urine dipstick: NEGATIVE
Ketones, ur: NEGATIVE mg/dL
Nitrite: NEGATIVE
Protein, ur: NEGATIVE mg/dL
Specific Gravity, Urine: 1.015 (ref 1.005–1.030)
Urobilinogen, UA: 0.2 mg/dL (ref 0.0–1.0)
pH: 7 (ref 5.0–8.0)

## 2011-02-08 LAB — GC/CHLAMYDIA PROBE AMP, GENITAL: Chlamydia, DNA Probe: NEGATIVE

## 2011-02-08 LAB — WET PREP, GENITAL
Trich, Wet Prep: NONE SEEN
Yeast Wet Prep HPF POC: NONE SEEN

## 2011-02-08 LAB — URINALYSIS, ROUTINE W REFLEX MICROSCOPIC
Bilirubin Urine: NEGATIVE
Glucose, UA: NEGATIVE mg/dL
Nitrite: NEGATIVE
Specific Gravity, Urine: 1.01 (ref 1.005–1.030)
pH: 7 (ref 5.0–8.0)

## 2011-02-08 LAB — STREP B DNA PROBE: Strep Group B Ag: NEGATIVE

## 2011-04-06 ENCOUNTER — Encounter: Payer: Self-pay | Admitting: Obstetrics & Gynecology

## 2011-04-06 ENCOUNTER — Ambulatory Visit (INDEPENDENT_AMBULATORY_CARE_PROVIDER_SITE_OTHER): Payer: Self-pay | Admitting: Obstetrics and Gynecology

## 2011-04-06 VITALS — BP 113/77 | HR 86 | Temp 98.0°F | Ht 66.0 in | Wt 126.6 lb

## 2011-04-06 DIAGNOSIS — N883 Incompetence of cervix uteri: Secondary | ICD-10-CM

## 2011-04-06 NOTE — Progress Notes (Signed)
Patient doing well presenting today for evaluation of short cervix. Patient with h/o abnormal pap s/p LEEP. Patient was able to care a pregnancy to term following LEEP procedure. Patient reports having normal pap smears since the LEEP and [facility]. Patient desires to conceive but was told by personnel at the [facility] that she should not get pregnant due to her short cervix. Patient presents today for second opinion.  PE: SSE: normal vaginal mucosa and physiologic discharge. Cervix is normal but visibly short Bimanual: Small anteverted uterus, no palpable adnexal mass. Short cervix (1-1.5cm in length)  A/P 35yo [id number] with short cervix s/p LEEP - it was explained to the patient thst since she was able to carry a term pregnancy in the past without any intervention, I do not see why another pregnancy would be different - It was also explained to the patient that every pregnancy is different and that her cervix is very short. Perhaps she may need a cerclage, if indicated. - Patient was advised to initiate prenatal vitamins and to seek care when she conceives.

## 2011-04-20 DIAGNOSIS — G43909 Migraine, unspecified, not intractable, without status migrainosus: Secondary | ICD-10-CM

## 2011-04-20 DIAGNOSIS — G43109 Migraine with aura, not intractable, without status migrainosus: Secondary | ICD-10-CM | POA: Insufficient documentation

## 2012-03-25 ENCOUNTER — Encounter: Payer: Self-pay | Admitting: Obstetrics and Gynecology

## 2012-03-28 LAB — CYTOLOGY - PAP: CYTOLOGY - PAP: NORMAL

## 2012-04-14 ENCOUNTER — Ambulatory Visit (INDEPENDENT_AMBULATORY_CARE_PROVIDER_SITE_OTHER): Payer: Self-pay | Admitting: Obstetrics and Gynecology

## 2012-04-14 ENCOUNTER — Encounter: Payer: Self-pay | Admitting: Obstetrics and Gynecology

## 2012-04-14 VITALS — BP 120/79 | HR 88 | Temp 96.7°F | Ht 65.0 in | Wt 114.3 lb

## 2012-04-14 DIAGNOSIS — N6459 Other signs and symptoms in breast: Secondary | ICD-10-CM

## 2012-04-14 DIAGNOSIS — N6452 Nipple discharge: Secondary | ICD-10-CM | POA: Insufficient documentation

## 2012-04-14 NOTE — Progress Notes (Signed)
  Subjective:    Patient ID: [name], female    DOB: August 20, 1975, 36 y.o.   MRN: [id number]  HPI 36 yo G2P2 referred from health department for evaluation of bilateral nipple discharge. Patient states that she noticed it a few months ago while taking a shower. It doesn't occur often but is only noted while taking a shower. Patient stopped breast feeding her child two years ago. She continues to have q28 days period, is using condoms for birth control and is planning on conceiving in the next few months. Patient denies any headaches or problems with her vision.   Review of Systems  All other systems reviewed and are negative.       Objective:   Physical Exam  GENERAL: Well-developed, well-nourished female in no acute distress.  HEENT: Normocephalic, atraumatic. Sclerae anicteric.  NECK: Supple. Normal thyroid.  LUNGS: Clear to auscultation bilaterally.  HEART: Regular rate and rhythm. BREASTS: Symmetric in size. No palpable masses or lymphadenopathy, skin changes, or nipple drainage. EXTREMITIES: No cyanosis, clubbing, or edema, 2+ distal pulses.     Assessment & Plan:  36 yo G2P2 with bilateral nipple discharge - Reassurance provided - will check prolactin level - Will order breast ultrasound - patient advised to start taking prenatal vitamins pre-conception

## 2012-04-14 NOTE — Progress Notes (Signed)
Called [facility] to schedule breast ultrasound, informed due to her age Mammomgram indicated. Also due to her being self pay- recommend make [facility] referral. Notified patient

## 2012-04-18 ENCOUNTER — Telehealth: Payer: Self-pay | Admitting: *Deleted

## 2012-04-18 NOTE — Telephone Encounter (Signed)
Pt left message requesting test results from 12/9.  Call back # [phone].

## 2012-04-23 NOTE — Telephone Encounter (Signed)
Called pt with Spanish interpreter, Byrd Hesselbach, and informed pt of normal result.  Pt stated that she wanted to make sure.  I informed pt that someone from the [facility] program will call her with an appt for her nipple discharge.  Pt stated she spoke with Eda concerning her appt for her mammogram.  I informed pt that she wouldn't get a screening mammogram until she is age of 74 years but she could possibly get a diagnostic mammo in which when the [facility] program calls her with the appt.  The [facility] program will be able to determine what she needs and that she her diagnostics will be covered by the program.  Pt stated understanding and did not have any further questions.

## 2012-04-24 ENCOUNTER — Ambulatory Visit: Payer: Self-pay | Admitting: Family Medicine

## 2012-04-24 ENCOUNTER — Other Ambulatory Visit: Payer: Self-pay | Admitting: Family Medicine

## 2012-04-24 VITALS — BP 105/68 | HR 84 | Temp 98.4°F | Resp 18 | Ht 65.0 in | Wt 112.0 lb

## 2012-04-24 DIAGNOSIS — G43109 Migraine with aura, not intractable, without status migrainosus: Secondary | ICD-10-CM

## 2012-04-24 MED ORDER — SUMATRIPTAN 20 MG/ACT NA SOLN: 1.0000 | Freq: Once | NASAL | Status: DC

## 2012-04-24 MED ORDER — KETOROLAC TROMETHAMINE 60 MG/2ML IM SOLN
60.0000 mg | Freq: Once | INTRAMUSCULAR | Status: AC
  Administered 2012-04-24: 60 mg via INTRAMUSCULAR

## 2012-04-24 MED ORDER — OXYCODONE-ACETAMINOPHEN 10-325 MG PO TABS: 1.0000 | ORAL_TABLET | Freq: Four times a day (QID) | ORAL | Status: DC | PRN

## 2012-04-24 MED ORDER — PROMETHAZINE HCL 25 MG PO TABS: 25.0000 mg | ORAL_TABLET | Freq: Four times a day (QID) | ORAL | Status: DC | PRN

## 2012-04-24 MED ORDER — PROMETHAZINE HCL 25 MG/ML IJ SOLN
25.0000 mg | Freq: Once | INTRAMUSCULAR | Status: AC
  Administered 2012-04-24: 25 mg via INTRAMUSCULAR

## 2012-04-24 NOTE — Telephone Encounter (Signed)
Please pull chart.

## 2012-04-24 NOTE — Progress Notes (Signed)
Subjective:    Patient ID: [name], female    DOB: [birth year], 36 y.o.   MRN: [id number] Chief Complaint  Patient presents with  . 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  She speaks some English but her husband provides most of the history and translates for pt. Usually she uses a Vicodin or percocet to treat them but if persists gives herself an Imitrex injection. Unfortunately, neither of these helped this time and she has now had a migraine x 2d and she has run out of her hydrocodone and imitrex.  This is typical for her migraines which occur right before her menstrual period, on the left side of forehead and apex. No ipsilateral eye watering or nasal discharge. Immed before the HAs begin, she does have an aura of floating lights.  Accompanied by nausea, vomiting, photo-, and phonophobia. She has tried multiple different oral triptans in the past w/o success.  She can no longer afford the imitrex injection - she does not have health insurance - she was initially able to obtain this during pregnancy medicaid I think.  Past Medical History  Diagnosis Date  . Migraines    Current Outpatient Prescriptions on File Prior to Visit  Medication Sig Dispense Refill  . Multiple Vitamins-Minerals (MULTIVITAMIN WITH MINERALS) tablet Take 1 tablet by mouth daily.                     Review of Systems  Constitutional: Positive for fatigue. Negative for fever, chills, diaphoresis, appetite change and unexpected weight change.  HENT: Negative for ear pain, congestion, rhinorrhea, neck pain, neck stiffness, dental problem, sinus pressure and tinnitus.   Eyes: Positive for photophobia. Negative for pain, discharge and visual disturbance.  Gastrointestinal: Positive for nausea and vomiting. Negative for abdominal pain.  Skin: Negative for rash.   Neurological: Positive for dizziness, weakness and headaches. Negative for tremors, seizures, syncope, facial asymmetry, speech difficulty, light-headedness and numbness.  Psychiatric/Behavioral: Positive for sleep disturbance.      BP 105/68  Pulse 84  Temp 98.4 F (36.9 C) (Oral)  Resp 18  Ht 5\' 5"  (1.651 m)  Wt 112 lb (50.803 kg)  BMI 18.64 kg/m2  LMP 04/07/2012 Objective:   Physical Exam  Constitutional: She is oriented to person, place, and time. She appears well-developed and well-nourished. She appears listless.       Laying on exam table in dark room, all lights off.  HENT:  Head: Normocephalic and atraumatic.  Right Ear: Tympanic membrane, external ear and ear canal normal.  Left Ear: Tympanic membrane, external ear and ear canal normal.  Nose: Nose normal. Right sinus exhibits no maxillary sinus tenderness. Left sinus exhibits no maxillary sinus tenderness.  Mouth/Throat: Uvula is midline, oropharynx is clear and moist and mucous membranes are normal. No oropharyngeal exudate.  Eyes: Conjunctivae normal and EOM are normal. Pupils are equal, round, and reactive to light.  Neck: Normal range of motion. Neck supple. No thyromegaly present.  Cardiovascular: Normal rate, regular rhythm, normal heart sounds and intact distal pulses.   Pulmonary/Chest: Effort normal and breath sounds normal. No respiratory distress.  Neurological: She is oriented to person, place, and time. She has normal strength. She appears listless. No cranial nerve deficit or sensory deficit. Coordination and gait normal.  Skin: Skin is warm and dry. She  is not diaphoretic.  Psychiatric: She has a normal mood and affect. Her behavior is normal.           Assessment & Plan:   1. Migraine headache with aura  promethazine (PHENERGAN) 25 MG tablet, oxyCODONE-acetaminophen (PERCOCET) 10-325 MG per tablet, SUMAtriptan (IMITREX) 20 MG/ACT nasal spray, ketorolac (TORADOL) injection 60 mg, promethazine  (PHENERGAN) injection 25 mg  Treatment options are limited due to cost (pt w/o ins.)  Gave rx for imitrex nasal solution so she can at least see if she can afford it.  Given 60mg  IM and promethazine 25mg  IM x 1 in office today to break HA.  Gave prn rx for percocet 10/325 disp #120, no refills and promethazine 25mg  disp #30, 5 refills. Take as soon as possible to migraine onset in addiction to an nsaid (ibuprofen 800 or naproxen 500).  Gave so many pills for each rx med since pt is unable to come in for refills due to OV cost.

## 2012-04-25 NOTE — Telephone Encounter (Signed)
Chart pulled - [id number]  Forwarded to PA Pool  bf

## 2012-05-02 ENCOUNTER — Ambulatory Visit: Payer: Self-pay

## 2012-05-16 ENCOUNTER — Encounter (HOSPITAL_COMMUNITY): Payer: Self-pay

## 2012-05-16 ENCOUNTER — Ambulatory Visit (HOSPITAL_COMMUNITY)
Admission: RE | Admit: 2012-05-16 | Discharge: 2012-05-16 | Disposition: A | Discharge status: Home / Self Care | Payer: Self-pay | Source: Ambulatory Visit | Attending: Obstetrics and Gynecology | Admitting: Obstetrics and Gynecology

## 2012-05-16 VITALS — BP 98/62 | Temp 98.1°F | Ht 66.0 in | Wt 114.2 lb

## 2012-05-16 DIAGNOSIS — Z1239 Encounter for other screening for malignant neoplasm of breast: Secondary | ICD-10-CM

## 2012-05-16 NOTE — Assessment & Plan Note (Signed)
Patient referred to the [facility] for Diagnostic Mammogram. Appointment scheduled for Friday, May 23, 2012 at 1445.

## 2012-05-16 NOTE — Progress Notes (Addendum)
Complaints of milky discharge bilateral breast with skin on nipples white and peeling.  Pap Smear:    Pap smear not performed today. Patients last Pap smear was November 2013 at the [facility] and normal. Per patient she has a history of an abnormal Pap smear in 2004 that required a follow up LEEP. Last Pap smear result is not in EPIC. Pap smears prior to last Pap smear are in EPIC including result to LEEP.  Physical exam: Breasts Breasts symmetrical. Bilateral nipples very dry and skin cracked. No nipple retraction bilateral breasts. No nipple discharge bilateral breasts on exam. Per patient she has occassional bilateral milking discharge. Unable to express and discharge on exam. No lymphadenopathy. No lumps palpated bilateral breasts. No complaints of pain or tenderness on exam.  Patient referred to the [facility] for Diagnostic Mammogram. Appointment scheduled for Friday, May 23, 2012 at 1445.    Pelvic/Bimanual No Pap smear completed today since last Pap smear was November 2013 and normal. Pap smear not indicated per [facility] guidelines.

## 2012-05-16 NOTE — Patient Instructions (Signed)
Taught patient how to perform BSE and gave educational materials to take home. Patient did not need a Pap smear today due to last Pap smear was November 2013 per patient. Let her know [facility] will cover Pap smears every 3 years unless has a history of abnormal Pap smears. Since patient has a history of an abnormal Pap smear that required a LEEP less than 10 years ago recommended her to get a repeat Pap smear November 2013. Let her know she can call Sabrina to schedule an appointment with [facility] for Pap smear. Patient referred to the [facility] for Diagnostic Mammogram. Appointment scheduled for Friday, May 23, 2012 at 1445. Patient aware of appointment and will be there. Patient verbalized understanding.

## 2012-05-23 ENCOUNTER — Ambulatory Visit
Admission: RE | Admit: 2012-05-23 | Discharge: 2012-05-23 | Disposition: A | Discharge status: Home / Self Care | Payer: No Typology Code available for payment source | Source: Ambulatory Visit | Attending: Obstetrics and Gynecology | Admitting: Obstetrics and Gynecology

## 2012-05-23 DIAGNOSIS — N6452 Nipple discharge: Secondary | ICD-10-CM

## 2012-07-05 ENCOUNTER — Encounter (HOSPITAL_COMMUNITY): Payer: Self-pay | Admitting: *Deleted

## 2012-07-05 ENCOUNTER — Emergency Department (HOSPITAL_COMMUNITY)
Admission: EM | Admit: 2012-07-05 | Discharge: 2012-07-05 | Disposition: A | Discharge status: Home / Self Care | Payer: Self-pay | Attending: Emergency Medicine | Admitting: Emergency Medicine

## 2012-07-05 DIAGNOSIS — G43909 Migraine, unspecified, not intractable, without status migrainosus: Secondary | ICD-10-CM | POA: Insufficient documentation

## 2012-07-05 DIAGNOSIS — R112 Nausea with vomiting, unspecified: Secondary | ICD-10-CM | POA: Insufficient documentation

## 2012-07-05 HISTORY — DX: Migraine, unspecified, not intractable, without status migrainosus: G43.909

## 2012-07-05 MED ORDER — METOCLOPRAMIDE HCL 5 MG/ML IJ SOLN
10.0000 mg | Freq: Once | INTRAMUSCULAR | Status: AC
  Administered 2012-07-05: 10 mg via INTRAMUSCULAR
  Filled 2012-07-05: qty 2

## 2012-07-05 MED ORDER — SUMATRIPTAN SUCCINATE 6 MG/0.5ML ~~LOC~~ SOLN
6.0000 mg | Freq: Once | SUBCUTANEOUS | Status: AC
  Administered 2012-07-05: 6 mg via SUBCUTANEOUS
  Filled 2012-07-05: qty 0.5

## 2012-07-05 NOTE — ED Notes (Signed)
Patient is alert and oriented x3.  She is complaining of a migraine that started yesterday.   She is rating her pain 10 of 10.  She is having sensitivity to light with the pain localized on  The left side of her head behind her eye.  She adds he is having nausea and vomiting.

## 2012-07-05 NOTE — ED Provider Notes (Signed)
History     CSN: [id number]  Arrival date & time 07/05/12  1947   First MD Initiated Contact with Patient 07/05/12 2050      Chief Complaint  Patient presents with  . Migraine    (Consider location/radiation/quality/duration/timing/severity/associated sxs/prior treatment) Patient is a 37 y.o. female presenting with migraines. The history is provided by the patient and a relative.  Migraine Associated symptoms include headaches. Pertinent negatives include no abdominal pain.  pt with hx migraine headaches, states is having a typical migraine headache for the past 2 days. Gradual onset. Slowly worse. No acute or abrupt change tonight. Tried hydrocodone without relief. No recent head injury, trauma or fall. No sinus drainage or pain. No fever or chills. No neck pain or stiffness. Headache worse w loud noise and/or bright lights. +n/v. No abd pain or distension.     Past Medical History  Diagnosis Date  . Migraines   . Migraine     Past Surgical History  Procedure Laterality Date  . Leep  2005    Family History  Problem Relation Age of Onset  . Hypertension Mother     History  Substance Use Topics  . Smoking status: Never Smoker   . Smokeless tobacco: Never Used  . Alcohol Use: No    OB History   Grav Para Term Preterm Abortions TAB SAB Ect Mult Living   2 2 2       2       Review of Systems  Constitutional: Negative for fever.  HENT: Negative for congestion and rhinorrhea.   Eyes: Negative for pain.  Gastrointestinal: Positive for nausea. Negative for abdominal pain.  Skin: Negative for rash.  Neurological: Positive for headaches. Negative for weakness and numbness.    Allergies  Review of patient's allergies indicates no known allergies.  Home Medications   Current Outpatient Rx  Name  Route  Sig  Dispense  Refill  . HYDROcodone-acetaminophen (NORCO/VICODIN) 5-325 MG per tablet   Oral   Take 1 tablet by mouth every 6 (six) hours as needed (pain).           BP 126/76  Pulse 116  Temp(Src) 97.8 F (36.6 C) (Oral)  Resp 18  SpO2 100%  LMP 07/04/2012  Physical Exam  Nursing note and vitals reviewed. Constitutional: She appears well-developed and well-nourished. No distress.  HENT:  Head: Atraumatic.  Nose: Nose normal.  Mouth/Throat: Oropharynx is clear and moist.  No sinus or temporal tenderness.  Eyes: Conjunctivae and EOM are normal. Pupils are equal, round, and reactive to light. No scleral icterus.  Neck: Neck supple. No tracheal deviation present. No thyromegaly present.  No stiffness or rigidity.   Cardiovascular: Normal rate, regular rhythm, normal heart sounds and intact distal pulses.  Exam reveals no gallop and no friction rub.   No murmur heard. Pulmonary/Chest: Effort normal and breath sounds normal. No respiratory distress.  Abdominal: Soft. Normal appearance and bowel sounds are normal. She exhibits no distension. There is no tenderness.  Genitourinary:  No cva tenderness.  Musculoskeletal: Normal range of motion. She exhibits no edema and no tenderness.  Neurological: She is alert. No cranial nerve deficit.  Alert, oriented. Motor intact bilaterally. Steady gait.   Skin: Skin is warm and dry. No rash noted. She is not diaphoretic.    ED Course  Procedures (including critical care time)     MDM  imitrex sq, reglan im.  Reviewed nursing notes and prior charts for additional history.   Recheck headache  resolved.   Pt appears stable for d/c.         Suzi Roots, MD 07/05/12 2159

## 2012-07-05 NOTE — ED Notes (Signed)
Patient with history of migraine. Patient reports being cold.

## 2012-07-06 ENCOUNTER — Ambulatory Visit: Payer: Self-pay | Admitting: Family Medicine

## 2012-07-06 VITALS — BP 146/103 | HR 112 | Temp 98.0°F | Resp 16 | Ht 65.0 in | Wt 109.0 lb

## 2012-07-06 DIAGNOSIS — G43509 Persistent migraine aura without cerebral infarction, not intractable, without status migrainosus: Secondary | ICD-10-CM

## 2012-07-06 DIAGNOSIS — R112 Nausea with vomiting, unspecified: Secondary | ICD-10-CM

## 2012-07-06 MED ORDER — PROMETHAZINE HCL 25 MG PO TABS: 25.0000 mg | ORAL_TABLET | Freq: Three times a day (TID) | ORAL | Status: DC | PRN

## 2012-07-06 MED ORDER — OXYCODONE-ACETAMINOPHEN 10-325 MG PO TABS: 1.0000 | ORAL_TABLET | Freq: Three times a day (TID) | ORAL | Status: DC | PRN

## 2012-07-06 MED ORDER — PROMETHAZINE HCL 12.5 MG PO TABS
25.0000 mg | ORAL_TABLET | Freq: Once | ORAL | Status: AC
  Administered 2012-07-06: 25 mg via ORAL

## 2012-07-06 MED ORDER — KETOROLAC TROMETHAMINE 60 MG/2ML IM SOLN
60.0000 mg | Freq: Once | INTRAMUSCULAR | Status: AC
  Administered 2012-07-06: 60 mg via INTRAMUSCULAR

## 2012-07-06 NOTE — Progress Notes (Signed)
[facility]:  Office Visit  Chief Complaint:  Chief Complaint  Patient presents with  . Migraine    HPI: Symiah Nowotny is a 37 y.o. female who complains of here with Migraine HA with photo and phono-phobia x 1 day, she is here with her husband. SHe was seen in the ER last night for IMitrex injection with releif for 1 hour but was worse. She was also given Reglan. She was given Norco but was unable to fill it because it was 2 in the morning. Her Gaylyn Rong is typical for her HAs but she has it on both sides instead of left side. She tinks this may ave been triggered by her period which starte don 2/28.   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 She speaks some English but her husband provides most of the history and translates for pt. Usually she uses a Vicodin or percocet to treat them but if persists gives herself an Imitrex injection. Unfortunately, neither of these helped this time and she has now had a migraine x 2d and she has run out of her hydrocodone and imitrex. This is typical for her migraines which occur right before her menstrual period, on the left side of forehead and apex. No ipsilateral eye watering or nasal discharge. Immed before the HAs begin, she does have an aura of floating lights. Accompanied by nausea, vomiting, photo-, and phonophobia.  She has tried multiple different oral triptans in the past w/o success. She can no longer afford the imitrex injection - she does not have health insurance - she was initially able to obtain this during pregnancy medicaid I think.   Past Medical History  Diagnosis Date  . Migraines   . Migraine    Past Surgical History  Procedure Laterality Date  . Leep  2005   History   Social History  . Marital Status: Single    Spouse Name: N/A    Number of Children: N/A  . Years of Education: N/A   Social  History Main Topics  . Smoking status: Never Smoker   . Smokeless tobacco: Never Used  . Alcohol Use: No  . Drug Use: No  . Sexually Active: Yes    Birth Control/ Protection: Condom   Other Topics Concern  . None   Social History Narrative  . None   Family History  Problem Relation Age of Onset  . Hypertension Mother    No Known Allergies Prior to Admission medications   Medication Sig Start Date End Date Taking? Authorizing Provider  HYDROcodone-acetaminophen (NORCO/VICODIN) 5-325 MG per tablet Take 1 tablet by mouth every 6 (six) hours as needed (pain).    Historical Provider, MD     ROS: The patient denies fevers, chills, night sweats, unintentional weight loss, chest pain, palpitations, wheezing, dyspnea on exertion, bdominal pain, dysuria, hematuria, melena, numbness, weakness, or tingling.   All other systems have been reviewed and were otherwise negative with the exception of those mentioned in the HPI and as above.    PHYSICAL EXAM: Filed Vitals:   07/06/12 1312  BP: 146/103  Pulse: 112  Temp: 98 F (36.7 C)  Resp: 16   Filed Vitals:   07/06/12 1312  Height: 5\' 5"  (1.651 m)  Weight: 109 lb (49.442 kg)   Body mass index is 18.14 kg/(m^2).  General: Alert, + moderate distress HEENT:  Normocephalic, atraumatic,  oropharynx patent. EOMI PERRLA, fundoscpic exam nl, + light sensitivity Cardiovascular:  Tachycardic, Regular rhythm, no rubs murmurs or gallops.  No Carotid bruits, radial pulse intact. No pedal edema.  Respiratory: Clear to auscultation bilaterally.  No wheezes, rales, or rhonchi.  No cyanosis, no use of accessory musculature GI: No organomegaly, abdomen is soft and non-tender, positive bowel sounds.  No masses. Skin: No rashes. Neurologic: Facial musculature symmetric. Psychiatric: Patient is appropriate throughout our interaction. Lymphatic: No cervical lymphadenopathy Musculoskeletal: Gait intact.   LABS: Results for orders placed in visit on  04/14/12  PROLACTIN      Result Value Range   Prolactin 11.1       EKG/XRAY:   Primary read interpreted by Dr. Conley Rolls at [facility]   ASSESSMENT/PLAN: Encounter Diagnoses  Name Primary?  . Migraine aura, persistent Yes  . Nausea with vomiting    Decline to get IVF in house, or IM meds except Toradol Rx Percocet Rx Promethazine She needs to follow-up with all the meds she has ever had for migraines, when she is not having migraines. I cannot get a very good hx from her sicne she is in so much pain, she can only say to me "no recordo" which means I can't remember, then she tells me something different about 5 minutes later.  We then can go over the list of meds that she has not tried. Everytime I ask her she states she does not rememebr but then retracts that she has tried it before, so I am not sure what the real story is She was given  rx of PErcocet #120 by Dr. Clelia Croft but never got it filled, I do not see it on her  Narcotic profile Advise to not take Norco but take Percocet F/u in 1 week or prn    LE, THAO PHUONG, DO 07/06/2012 2:17 PM

## 2012-09-26 ENCOUNTER — Telehealth: Payer: Self-pay

## 2012-09-26 NOTE — Telephone Encounter (Signed)
Patient's sister called in on behalf of the patient.  She states her sister came in here previously suffering from headaches and she was given a prescription.  Patient's sister is requesting another precription to treat the headaches.  Please call [phone].

## 2012-09-26 NOTE — Telephone Encounter (Signed)
Patients spouse Charlcie Cradle) called for her. I explained previous message from you. He said she doesn't have money to come in. He wanted to know if you could just write for a couple pills. She is having a migraine now. I told him I would send this request to you but stressed that she needed to RTC. Please advise if you could do just a couple pills.

## 2012-09-27 ENCOUNTER — Telehealth: Payer: Self-pay | Admitting: Family Medicine

## 2012-09-27 DIAGNOSIS — R112 Nausea with vomiting, unspecified: Secondary | ICD-10-CM

## 2012-09-27 DIAGNOSIS — G43509 Persistent migraine aura without cerebral infarction, not intractable, without status migrainosus: Secondary | ICD-10-CM

## 2012-09-27 MED ORDER — PROMETHAZINE HCL 25 MG PO TABS: 25.0000 mg | ORAL_TABLET | Freq: Three times a day (TID) | ORAL | Status: DC | PRN

## 2012-09-27 NOTE — Telephone Encounter (Signed)
I left a message for Charlcie Cradle, the husband saying that I am not sure what they took or did not take, but  She has been given a refill of her promethazine and hopefully that will help. She was not taking percocet when I last saw her and I do not know if she even started it after she left visit with me. If she continues to have HA then she needs to come into the office. If they call and I am in the office then I can speak with one of them but other than that I don't know what else to do for them.

## 2012-10-03 NOTE — Telephone Encounter (Signed)
PT'S SISTER WOULD LIKE TO HAVE PERCOCET CALLED INTO THE PHARMACY FOR HER HEADACHES. BEST# [phone] (MARIA) PHARMACY:[facility] [address]

## 2012-10-04 NOTE — Telephone Encounter (Signed)
Spoke with patient and states she does not have money. She didn't know how much it would cost to come in and be seen. She is requesting Dr. Conley Rolls give her a call. [phone].

## 2013-01-26 LAB — OB RESULTS CONSOLE ANTIBODY SCREEN: Antibody Screen: NEGATIVE

## 2013-01-26 LAB — OB RESULTS CONSOLE RUBELLA ANTIBODY, IGM: Rubella: IMMUNE

## 2013-01-26 LAB — OB RESULTS CONSOLE HEPATITIS B SURFACE ANTIGEN: Hepatitis B Surface Ag: NEGATIVE

## 2013-01-26 LAB — OB RESULTS CONSOLE VARICELLA ZOSTER ANTIBODY, IGG: Varicella: IMMUNE

## 2013-01-26 LAB — OB RESULTS CONSOLE HGB/HCT, BLOOD: HCT: 35 %

## 2013-01-26 LAB — OB RESULTS CONSOLE ABO/RH: RH Type: POSITIVE

## 2013-01-26 LAB — GLUCOSE, 1 HOUR GESTATIONAL: Glucose 1 Hr Prenatal, POC: 84 mg/dL

## 2013-02-02 ENCOUNTER — Other Ambulatory Visit: Payer: Self-pay | Admitting: Family Medicine

## 2013-02-02 DIAGNOSIS — Z3682 Encounter for antenatal screening for nuchal translucency: Secondary | ICD-10-CM

## 2013-02-03 ENCOUNTER — Ambulatory Visit (HOSPITAL_COMMUNITY)
Admission: RE | Admit: 2013-02-03 | Discharge: 2013-02-03 | Disposition: A | Discharge status: Home / Self Care | Payer: Medicaid Other | Source: Ambulatory Visit | Attending: Family Medicine | Admitting: Family Medicine

## 2013-02-03 ENCOUNTER — Ambulatory Visit (HOSPITAL_COMMUNITY): Admission: RE | Admit: 2013-02-03 | Payer: Medicaid Other | Source: Ambulatory Visit

## 2013-02-03 ENCOUNTER — Encounter (HOSPITAL_COMMUNITY): Payer: Self-pay

## 2013-02-03 ENCOUNTER — Other Ambulatory Visit: Payer: Self-pay

## 2013-02-03 DIAGNOSIS — Z3682 Encounter for antenatal screening for nuchal translucency: Secondary | ICD-10-CM

## 2013-02-03 DIAGNOSIS — Z36 Encounter for antenatal screening of mother: Secondary | ICD-10-CM | POA: Insufficient documentation

## 2013-02-05 ENCOUNTER — Encounter: Payer: Self-pay | Admitting: *Deleted

## 2013-02-05 ENCOUNTER — Encounter: Payer: Self-pay | Admitting: Obstetrics and Gynecology

## 2013-02-09 ENCOUNTER — Other Ambulatory Visit: Payer: Self-pay | Admitting: Family Medicine

## 2013-02-09 ENCOUNTER — Encounter: Payer: Self-pay | Admitting: Family Medicine

## 2013-02-09 ENCOUNTER — Ambulatory Visit (INDEPENDENT_AMBULATORY_CARE_PROVIDER_SITE_OTHER): Payer: Medicaid Other | Admitting: Family Medicine

## 2013-02-09 VITALS — BP 120/80 | Temp 97.0°F | Wt 112.5 lb

## 2013-02-09 DIAGNOSIS — O09212 Supervision of pregnancy with history of pre-term labor, second trimester: Secondary | ICD-10-CM

## 2013-02-09 DIAGNOSIS — O09219 Supervision of pregnancy with history of pre-term labor, unspecified trimester: Secondary | ICD-10-CM

## 2013-02-09 DIAGNOSIS — IMO0002 Reserved for concepts with insufficient information to code with codable children: Secondary | ICD-10-CM | POA: Insufficient documentation

## 2013-02-09 DIAGNOSIS — O09529 Supervision of elderly multigravida, unspecified trimester: Secondary | ICD-10-CM

## 2013-02-09 DIAGNOSIS — O3440 Maternal care for other abnormalities of cervix, unspecified trimester: Secondary | ICD-10-CM

## 2013-02-09 DIAGNOSIS — O3442 Maternal care for other abnormalities of cervix, second trimester: Secondary | ICD-10-CM

## 2013-02-09 DIAGNOSIS — Z141 Cystic fibrosis carrier: Secondary | ICD-10-CM

## 2013-02-09 DIAGNOSIS — O0990 Supervision of high risk pregnancy, unspecified, unspecified trimester: Secondary | ICD-10-CM

## 2013-02-09 DIAGNOSIS — O09899 Supervision of other high risk pregnancies, unspecified trimester: Secondary | ICD-10-CM | POA: Insufficient documentation

## 2013-02-09 DIAGNOSIS — O09892 Supervision of other high risk pregnancies, second trimester: Secondary | ICD-10-CM

## 2013-02-09 DIAGNOSIS — O09522 Supervision of elderly multigravida, second trimester: Secondary | ICD-10-CM

## 2013-02-09 DIAGNOSIS — Z0489 Encounter for examination and observation for other specified reasons: Secondary | ICD-10-CM

## 2013-02-09 DIAGNOSIS — Z8759 Personal history of other complications of pregnancy, childbirth and the puerperium: Secondary | ICD-10-CM | POA: Insufficient documentation

## 2013-02-09 LAB — POCT URINALYSIS DIP (DEVICE)
Glucose, UA: NEGATIVE mg/dL
Ketones, ur: NEGATIVE mg/dL
Protein, ur: NEGATIVE mg/dL
pH: 5.5 (ref 5.0–8.0)

## 2013-02-09 NOTE — Progress Notes (Signed)
Cervical Length U/S scheduled with MFM on 03/06/13 at 9 am. Anatomy U/S scheduled 03/20/13 at 930 am in MFM.ML on pt's vm to return call to the clinic by Alexandra.(interpreter) 1025 am Patient returned call and advised of anatomy u/s by Alexandra/ spanish interpreter.

## 2013-02-09 NOTE — Progress Notes (Signed)
P= 103 Pt. States she has some indigestion/heart burn after eating. States she felt the baby move this morning. Discussed appropriate weight gain for pt. According to BMI. Pt. Verbalizes understanding.

## 2013-02-09 NOTE — Patient Instructions (Signed)
Embarazo - Segundo trimestre (Pregnancy - Second Trimester) El segundo trimestre del Psychiatrist (del 3 al 6 mes) es un perodo de evolucin rpida para usted y el beb. Hacia el final del sexto mes, el beb mide aproximadamente 23 cm y pesa 680 g. Comenzar a Pharmacologist del beb [place] 18 y las 20 [address] de [place]. Podr sentir las pataditas ("quickening en ingls"). Hay un rpido [employer]. Puede segregar un lquido claro Charity fundraiser) de las [place]. Quizs sienta pequeas contracciones en el vientre (tero) Esto se conoce como falso trabajo de parto o contracciones de Braxton-Hicks. Es como una prctica del trabajo de parto que se produce cuando el beb est listo para salir. Generalmente los problemas de vmitos matinales ya se han superado hacia el final del Medical sales representative. Algunas mujeres desarrollan pequeas manchas oscuras (que se denominan cloasma, mscara del embarazo) en la cara que normalmente se van luego del nacimiento del beb. La exposicin al sol empeora las manchas. Puede desarrollarse acn en algunas mujeres embarazadas, y puede desaparecer en aquellas que ya tienen acn. EXAMENES PRENATALES  Durante los Manpower Inc, deber seguir realizando pruebas de [place], segn avance el [place]. Estas pruebas se realizan para controlar su salud y la del beb. Tambin se realizan anlisis de sangre para [employer] niveles de [place]. La anemia (bajo nivel de hemoglobina) es frecuente durante el embarazo. Para prevenirla, se administran hierro y vitaminas. Tambin se le realizarn exmenes para saber si tiene diabetes entre las 24 y las 28 semanas del [place]. Podrn repetirle algunas de las Hovnanian Enterprises hicieron previamente.  En cada visita le medirn el tamao del tero. Esto se realiza para asegurarse de que el beb est creciendo correctamente de acuerdo al estado del [place].  Tambin en cada visita prenatal controlarn su presin arterial. Esto se realiza  para asegurarse de que no tenga toxemia.  Se controlar su orina para asegurarse de que no tenga infecciones, diabetes o protena en la orina.  Se controlar su peso regularmente para asegurarse que el aumento ocurre al ritmo indicado. Esto se hace para asegurarse que usted y el beb tienen una evolucin normal.  En algunas ocasiones se realiza una prueba de ultrasonido para confirmar el correcto desarrollo y evolucin del beb. Esta prueba se realiza con ondas sonoras inofensivas para el beb, de modo que el profesional pueda calcular ms precisamente la fecha del [place]. Algunas veces se realizan pruebas especializadas del lquido amnitico que rodea al beb. Esta prueba se denomina amniocentesis. El lquido amnitico se obtiene introduciendo una aguja en el vientre (abdomen). Se realiza para Conservator, museum/gallery en los que existe alguna preocupacin acerca de algn problema gentico que pueda sufrir el beb. En ocasiones se lleva a cabo cerca del final del embarazo, si es necesario inducir al Apple Computer. En este caso se realiza para asegurarse que los pulmones del beb estn lo suficientemente maduros como para que pueda vivir fuera del tero. CAMBIOS QUE OCURREN EN EL SEGUNDO TRIMESTRE DEL EMBARAZO Su organismo atravesar numerosos cambios durante el Big Lots. Estos pueden variar de Neomia Dear persona a otra. [place] con el profesional que la asiste acerca los cambios que usted note y que la preocupen.  Durante el segundo trimestre probablemente sienta un aumento del apetito. Es normal tener "antojos" de Development worker, community. Esto vara de Neomia Dear persona a otra y de un embarazo a Therapist, art.  El abdomen inferior comenzar a abultarse.  Podr tener la necesidad de Geographical information systems officer con ms frecuencia debido a  que el tero y el beb presionan sobre la vejiga. Tambin es frecuente contraer ms infecciones urinarias durante el [employer] Lots. Puede evitarlas bebiendo gran cantidad de lquidos y vaciando la vejiga antes y  despus de Sales promotion account executive.  Podrn aparecer las primeras estras en las caderas, abdomen y [place]. Estos son cambios normales del cuerpo durante el [place]. No existen medicamentos ni ejercicios que puedan prevenir CarMax.  Es posible que comience a desarrollar venas inflamadas y abultadas (varices) en las piernas. El uso de medias de descanso, Optometrist sus pies durante 15 minutos, 3 a 4 veces al da y Film/video editor la sal en su dieta ayuda a Journalist, newspaper.  Podr sentir Engineering geologist gstrica a medida que el tero crece y Doctor, general practice. Puede tomar anticidos, con la autorizacin de su mdico, para Financial planner. Tambin es til ingerir pequeas comidas 4 a 5 veces al Futures trader.  La constipacin puede tratarse con un laxante o agregando fibra a su dieta. Beber grandes cantidades de lquidos, comer vegetales, frutas y granos integrales es de [place].  Tambin es beneficioso practicar actividad fsica. Si ha sido una persona Engineer, mining, podr continuar con la Harley-Davidson de las actividades durante el mismo. Si ha sido [employer], puede ser beneficioso que comience con un programa de ejercicios, Museum/gallery exhibitions officer.  Puede desarrollar hemorroides hacia el final del segundo trimestre. Tomar baos de asiento tibios y Chemical engineer cremas recomendadas por el profesional que lo asiste sern de ayuda para los problemas de hemorroides.  Tambin podr Financial risk analyst de espalda durante este momento de su embarazo. Evite levantar objetos pesados, utilice zapatos de taco bajo y [place] buena postura para ayudar a reducir los problemas de [place].  Algunas mujeres embarazadas desarrollan hormigueo y adormecimiento de la mano y los dedos debido a la hinchazn y compresin de los ligamentos de la mueca (sndrome del tnel carpiano). Esto desaparece una vez que el beb nace.  Como sus pechos se agrandan, Pension scheme manager un sujetador ms grande. Use un sostn de soporte,  cmodo y de algodn. No utilice un sostn para amamantar hasta el ltimo mes de embarazo si va a amamantar al beb.  Podr observar una lnea oscura desde el ombligo hacia la zona pbica denominada linea nigra.  Podr observar que sus mejillas se ponen coloradas debido al aumento de flujo sanguneo en la cara.  Podr desarrollar "araitas" en la cara, cuello y pecho. Esto desaparece una vez que el beb nace. INSTRUCCIONES PARA EL CUIDADO DOMICILIARIO  Es extremadamente importante que evite el cigarrillo, hierbas medicinales, alcohol y las drogas no prescriptas durante el Psychiatrist. Estas sustancias qumicas afectan la formacin y el desarrollo del beb. Evite estas sustancias durante todo el embarazo para asegurar el nacimiento de un beb sano.  La mayor parte de los cuidados que se aconsejan son los mismos que los indicados para Financial risk analyst trimestre del Psychiatrist. Cumpla con las citas tal como se le indic. Siga las instrucciones del profesional que lo asiste con respecto al uso de los medicamentos, el ejercicio y Psychologist, forensic.  Durante el embarazo debe obtener nutrientes para usted y para su beb. Consuma alimentos balanceados a intervalos regulares. Elija alimentos como carne, pescado, [place] y otros productos lcteos descremados, vegetales, frutas, panes integrales y cereales. El Equities trader cul es el aumento de peso ideal.  Las relaciones sexuales fsicas pueden continuarse hasta cerca del fin del embarazo si no existen otros problemas. Estos problemas pueden ser la prdida temprana (  prematura) de lquido amnitico de las [place], sangrado vaginal, dolor abdominal u otros problemas mdicos o del Psychiatrist.  Realice Tesoro Corporation, si no tiene restricciones. Consulte con el profesional que la asiste si no sabe con certeza si determinados ejercicios son seguros. El mayor aumento de peso tiene Environmental consultant durante los ltimos 2 trimestres del Psychiatrist. El ejercicio la ayudar  a:  Engineering geologist.  Ponerla en forma para el parto.  Ayudarla a perder peso luego de haber dado a luz.  Use un buen sostn o como los que se usan para hacer deportes para Paramedic la sensibilidad de las [place]. Tambin puede serle til si lo [place] mientras duerme. Si pierde Product manager, podr Parker Hann[employer]in.  No utilice la baera con agua caliente, baos turcos y saunas durante el [address]  Utilice el cinturn de seguridad sin excepcin cuando conduzca. Este la proteger a usted y al beb en caso de accidente.  Evite comer carne cruda, queso crudo, y el contacto con los utensilios y desperdicios de los gatos. Estos elementos contienen grmenes que pueden causar defectos de nacimiento en el beb.  El segundo trimestre es un buen momento para visitar a su dentista y Software engineer si an no lo ha hecho. Es Primary school teacher los dientes limpios. Utilice un cepillo de dientes blando. Cepllese ms suavemente durante el embarazo.  Es ms fcil perder algo de orina durante el [place]. Apretar y Chief Operating Officer los msculos de la pelvis la ayudar con este problema. Practique detener la miccin cuando est en el bao. Estos son los mismos msculos que Development worker, international aid. Son [employer] Partners mismos msculos que utiliza cuando trata de Ryder System gases. Puede practicar apretando estos msculos 10 veces, y repetir esto 3 veces por da aproximadamente. Una vez que conozca qu msculos debe apretar, no realice estos ejercicios durante la miccin. Puede favorecerle una infeccin si la orina vuelve hacia atrs.  Pida ayuda si tiene necesidades econmicas, de asesoramiento o nutricionales durante el [place]. El profesional podr ayudarla con respecto a estas necesidades, o derivarla a otros especialistas.  La piel puede ponerse grasa. Si esto sucede, lvese la cara con un jabn [place], utilice un humectante no graso y [place] con base de aceite o crema. CONSUMO DE MEDICAMENTOS Y DROGAS  DURANTE EL EMBARAZO  Contine tomando las vitaminas apropiadas para esta etapa tal como se le indic. Las vitaminas deben contener un miligramo de cido flico y deben suplementarse con hierro. Guarde todas las vitaminas fuera del alcance de los nios. La ingestin de slo un par de vitaminas o tabletas que contengan hierro puede ocasionar la Newmont Mining en un beb o en un nio pequeo.  Evite el uso de [place], inclusive los de venta libre y hierbas que no hayan sido prescriptos o indicados por el profesional que la asiste. Algunos medicamentos pueden causar problemas fsicos al beb. Utilice los medicamentos de venta libre o de prescripcin para Chief Technology Officer, Environmental health practitioner o la [place], segn se lo indique el profesional que lo asiste. No utilice aspirina.  El consumo de alcohol est relacionado con ciertos defectos de nacimiento. Esto incluye el sndrome de alcoholismo fetal. Debe evitar el consumo de alcohol en cualquiera de sus formas. El cigarrillo causa nacimientos prematuros y bebs de [place]. El uso de drogas recreativas est absolutamente prohibido. Son muy nocivas para el beb. Un beb que nace de American Express, ser adicto al nacer. Ese beb tendr los mismos sntomas de abstinencia que un adulto.  Infrmele al profesional si consume alguna droga.  No consuma drogas ilegales. Pueden causarle mucho dao al beb. SOLICITE ATENCIN MDICA SI: Tiene preguntas o preocupaciones durante su embarazo. Es mejor que llame para Science writer las dudas que esperar hasta su prxima visita prenatal. Thressa Sheller forma se sentir ms tranquila.  SOLICITE ATENCIN MDICA DE INMEDIATO SI:  La temperatura oral se eleva sin motivo por encima de 102 F (38.9 C) o segn le indique el profesional que lo asiste.  Tiene una prdida de lquido por la vagina (canal de parto). Si sospecha una ruptura de las [place], tmese la temperatura y llame al profesional para informarlo sobre esto.  Observa unas pequeas manchas,  una hemorragia vaginal o elimina cogulos. Notifique al profesional acerca de la cantidad y de cuntos apsitos est utilizando. Unas pequeas manchas de sangre son algo comn durante el Psychiatrist, especialmente despus de Sales promotion account executive.  Presenta un olor desagradable en la secrecin vaginal y observa un cambio en el color, de transparente a blanco.  Contina con las nuseas y no obtiene alivio de los remedios indicados. Vomita sangre o algo similar a la borra del caf.  [place] o sube ms de 900 g. en una semana, o segn lo indicado por el profesional que la asiste.  Observa que se le Southwest Airlines, las manos, los pies o las piernas.  Ha estado expuesta a la rubola y no ha sufrido la enfermedad.  Ha estado expuesta a la quinta enfermedad o a la varicela.  Presenta dolor abdominal. Las molestias en el ligamento redondo son Neomia Dear causa no cancerosa (benigna) frecuente de dolor abdominal durante el embarazo. El profesional que la asiste deber evaluarla.  Presenta dolor de cabeza intenso que no se [place].  Presenta fiebre, diarrea, dolor al orinar o le falta la respiracin.  Presenta dificultad para ver, visin borrosa, o visin doble.  Sufre una cada, un accidente de trnsito o cualquier tipo de trauma.  Vive en un hogar en el que existe violencia fsica o mental. Document Released: 01/31/2005 Document Revised: 01/16/2012 [facility] Patient Information 2014 [place], [place].  Lactancia materna  (Breastfeeding)  El cambio hormonal durante el Psychiatrist produce el desarrollo del tejido [place] y un aumento en el nmero y tamao de los conductos galactforos. La hormona prolactina permite que las protenas, los azcares y las grasas de la sangre produzcan la [employer] materna en las glndulas productoras de [place]. La hormona progesterona impide que la leche materna sea liberada antes del nacimiento del beb. Despus del nacimiento del beb, su nivel de progesterona disminuye  permitiendo que la leche materna sea [place]. Pensar en el beb, as como la succin o Theatre manager, pueden estimular la liberacin de [place] de las glndulas productoras de [place].  La decisin de Company secretary) es una de las mejores opciones que usted puede hacer para usted y su beb. La informacin que sigue da una breve resea de los beneficios, as Lexicographer que debe saber sobre la [place].  LOS BENEFICIOS DE AMAMANTAR  Para el beb   La primera leche (calostro) ayuda al mejor funcionamiento del sistema digestivo del beb.   La leche tiene anticuerpos que provienen de la madre y que ayudan a prevenir las infecciones en el beb.   El beb tiene una menor incidencia de asma, alergias y del sndrome de muerte sbita del lactante (SMSL).   Los nutrientes de la [place] materna son mejores para el beb que la [place].  La leche materna mejora  el desarrollo cerebral del beb.   Su beb tendr menos gases, clicos y estreimiento.  Es menos probable que el beb desarrolle otras enfermedades, como obesidad infantil, asma o diabetes mellitus. Para usted   La lactancia materna favorece el desarrollo de un vnculo muy especial entre la madre y el beb.   Es ms conveniente, siempre disponible, a la [place] y [place].   La lactancia materna ayuda a quemar caloras y a perder el peso ganado durante el [place].   Hace que el tero se contraiga ms rpidamente a su tamao normal y Consolidated Edison sangrado despus del [place].   Las [employer] Holdings que amamantan tienen menos riesgo de Environmental education officer osteoporosis o cncer de mama o de ovario en el futuro.  FRECUENCIA DEL AMAMANTAMIENTO   Un beb sano, nacido a trmino, puede amamantarse con tanta frecuencia como cada hora, o espaciar las comidas cada tres horas. La frecuencia en la lactancia varan de un beb a otro.   Los recin nacidos deben ser alimentados por lo menos cada 2-3 horas  Administrator y cada 4-5 horas durante la noche. Usted debe amamantarlo un mnimo de 8 tomas en un perodo de 24 horas.  Despierte al beb para amamantarlo si han pasado 3-4 horas desde la ltima comida.  Amamante cuando sienta la necesidad de reducir la plenitud de sus senos o cuando el beb muestre signos de [place]. Las seales de que el beb puede Gentry Fitz son:  Lenora Boys su estado de alerta o vigilancia.  Se estira.  Mueve la cabeza de un lado a otro.  Mueve la cabeza y abre la boca cuando se le toca la mejilla o la boca (reflejo de succin).  Aumenta las vocalizaciones, tales como sonidos de succin, relamerse los labios, arrullos, suspiros, o chirridos.  Mueve la [employer] boca.  Se chupa con ganas los dedos o las manos.  Agitacin.  Llanto intermitente.  Los signos de hambre extrema requerirn que lo calme y lo consuele antes de tratar de alimentarlo. Los signos de hambre extrema son:  Agitacin.  Llanto fuerte e intenso.  Gritos.  El amamantamiento frecuente la ayudar a producir ms [place] y a Education officer, community de Engineer, mining en los pezones e hinchazn de las [place].  LACTANCIA MATERNA   Ya sea que se encuentre acostada o sentada, asegrese que el abdomen del beb est enfrente el suyo.   Sostenga la mama con el pulgar por arriba y los otros 4 dedos por debajo del pezn. Asegrese que sus dedos se encuentren lejos del pezn y de la boca del beb.   Empuje suavemente los labios del beb con el pezn o con el dedo.   Cuando la boca del beb se abra lo suficiente, introduzca el pezn y la zona oscura que lo rodea (areola) tanto como le sea posible dentro de la boca.  Debe haber ms areola visible por arriba del labio superior que por debajo del labio inferior.  La lengua del beb debe estar entre la enca inferior y el seno.  Asegrese de que la boca del beb est en la posicin correcta alrededor del pezn (prendida). Los labios del beb deben crear un sello  sobre su pecho.  Las seales de que el beb se ha prendido eficazmente al pezn son:  Payton Doughty o succiona sin dolor.  Se escucha que traga Lyondell [employer].  No hace ruidos ni chasquidos.  Hay movimientos musculares por arriba y por delante de sus odos al Printmaker.  El beb debe  succionar unos 2-3 minutos para que salga la [place]. Permita que el nio se alimente en cada mama todo lo que desee. Alimente al beb hasta que se desprenda o se quede dormido en Freight forwarder y luego ofrzcale el segundo pecho.  Las seales de que el beb est lleno y satisfecho son:  Disminuye gradualmente el nmero de succiones o no succiona.  Se queda dormido.  Extiende o relaja su cuerpo.  Retiene una pequea cantidad de [facility] boca.  Se desprende del pecho por s mismo.  Los signos de una lactancia materna eficaz son:  Los senos han aumentado la firmeza, el peso y el tamao antes de la alimentacin.  Son ms blandos despus de amamantar.  Un aumento del volumen de [place], y tambin el cambio de su consistencia y color se producen hacia el quinto da de Tour manager.  La congestin mamaria se [place] al dar de [place].  Los pezones no duelen, ni estn agrietados ni sangran.  De ser necesario, interrumpa la succin poniendo su dedo en la esquina de la boca del beb y deslizando el dedo entre sus encas. A continuacin, retire la mama de su boca.  Es comn que los bebs regurgiten un poco despus de comer.  A menudo los bebs tragan aire al alimentarse. Esto puede hacer que se sienta molesto. Hacer eructar al beb al Pilar Plate de pecho puede ser de [place].  Se recomiendan suplementos de vitamina D para los bebs que reciben slo [address]  Evite el uso del chupete durante las primeras 4 a 6 semanas de vida.  Evite la alimentacin suplementaria con agua, frmula o jugo en lugar de la Colgate Pal[employer]. La leche materna es todo el alimento que el beb necesita. No es necesario que el  nio ingiera agua o preparados de bibern. Sus pechos producirn ms leche si se evita la alimentacin suplementaria durante las primeras semanas. COMO SABER SI EL BEB OBTIENE LA SUFICIENTE LECHE MATERNA  Preguntarse si el beb obtiene la cantidad suficiente de [place] es una preocupacin frecuente Luc[employer]. Puede asegurarse que el beb tiene la leche suficiente si:   El beb succiona activamente y usted escucha que traga.   El beb parece estar relajado y satisfecho despus de Psychologist, clinical.   El nio se alimenta al menos 8 a 12 veces en 24 horas.  Durante los primeros 3 a 5 das de vida:  Moja 3-5 paales en 24 horas. La materia fecal debe ser blanda y [place].  Tiene al menos 3 a 4 deposiciones en 24 horas. La materia fecal debe ser blanda y [place].  A los 5-7 das de vida, el beb debe tener al menos 3-6 deposiciones en 24 horas. La materia fecal debe ser grumosa y [place] a los 5 [address] de [place].  Su beb tiene una prdida de Psychologist, counselling a 7al 10% durante los primeros 3 [address]  [address] de [address]  El beb no pierde peso despus de 3-7 [address] de [address]  El beb debe aumentar 4 a 6 libras (120 a 170 gr.) por semana despus de los 4 [address] de vida.  Aumenta de [place] a los [address] de vida y vuelve al peso del nacimiento dentro de las 2 semanas. CONGESTIN MAMARIA  Durante la primera semana despus del [place], usted puede experimentar hinchazn en las mamas (congestin [place]). Al estar congestionadas, las mamas se sienten pesadas, calientes o sensibles al tacto. El pico de la congestin ocurre a las 24 -48 horas despus del parto.  La congestin puede disminuirse:  Continuando con la Tour manager.  Aumentando la frecuencia.  Tomando duchas calientes o aplicando calor hmedo en los senos antes de cada comida. Esto aumenta la circulacin y [place] a que la [place].   Masajeando suavemente el pecho antes y [employer]. Con las yemas de los dedos, masajee la pared del pecho hacia  el pezn en un movimiento circular.   Asegurarse de que el beb vaca al menos uno de sus pechos en cada alimentacin. Tambin ayuda si comienza la siguiente toma en el otro seno.   Extraiga manualmente o con un sacaleches las mamas para vaciar los pechos si el beb tiene sueo o no se aliment bien. Tambin puede extraer la W[employer] Resources cuando vuelva a trabajar o si siente que se estn congestionando las [place].  Asegrese de que el beb se prende y est bien colocado durante la Market researcher. Si sigue estas indicaciones, la congestin debe mejorar en 24 a 48 horas. Si an tiene dificultades, consulte a Barista.  CUDESE USTED MISMA  Cuide sus mamas.   Bese o dchese diariamente.   Evite usar Eaton Corporation.   Use un sostn de soporte Evite el uso de sostenes con aro.  Seque al aire sus pezones durante 3-4 minutos despus de cada comida.   Utilice slo apsitos de algodn en el sostn para absorber las prdidas de [place]. La prdida de un poco de Deere & Company las comidas es normal.   Use solamente lanolina pura en sus pezones despus de Museum/gallery exhibitions officer. Usted no tiene que lavarla antes de alimentar al beb. Otra opcin es sacarse unas gotas de [place] y Pep[employer] Holdings pezones.  Continuar con los autocontroles de la mama. Cudese.   Consuma alimentos saludables. Alterne 3 comidas con 3 colaciones.  Evite los alimentos que usted nota que perjudican al beb.  Dixie Dials, jugos de fruta y agua para Patent examiner su sed (aproximadamente 8 vasos al Futures trader).   Descanse con frecuencia, reljese y tome sus vitaminas prenatales para evitar la fatiga, el estrs y la anemia.  Evite masticar y fumar tabaco.  Evite el consumo de alcohol y drogas.  Tome medicamentos de venta libre y recetados tal como le indic su mdico o Social research officer, government. Siempre debe consultar con su mdico o farmacutico antes de tomar cualquier medicamento, vitamina o suplemento de hierbas.  Sepa que  durante la lactancia puede quedar embarazada. Si lo desea, hable con su mdico acerca de la planificacin familiar y los mtodos anticonceptivos seguros que puede utilizar durante la Market researcher. SOLICITE ATENCIN MDICA SI:   Usted siente que quiere dejar de Museum/gallery exhibitions officer o se siente frustrada con la lactancia.  Siente dolor en los senos o en los pezones.  Sus pezones estn agrietados o Water quality scientist.  Sus pechos estn irritados, sensibles o calientes.  Tiene un rea hinchada en cualquiera de los senos.  Siente escalofros o fiebre.  Tiene nuseas o vmitos.  Observa un drenaje en los pezones.  Sus mamas no se llenan antes de Marine scientist al 5to da despus del [place].  Se siente triste y deprimida.  El nio est demasiado somnoliento como para comer.  El nio tiene problemas para Industrial/product designer.   Moja menos de 3 paales en 24 horas.  Mueve el intestino menos de 3 veces en 24 horas.  La piel del beb o la parte blanca de sus ojos est ms amarilla.   El beb no ha aumentado de [facility] a los [address] de [place].  ASEGRESE DE QUE:   Comprende estas instrucciones.  Controlar su enfermedad.  Solicitar ayuda de inmediato si no mejora o si empeora. Document Released: 04/23/2005 Document Revised: 01/16/2012 [facility] Patient Information 2014 [place], [place].

## 2013-02-09 NOTE — Progress Notes (Signed)
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

## 2013-02-12 ENCOUNTER — Telehealth (HOSPITAL_COMMUNITY): Payer: Self-pay | Admitting: MS"

## 2013-02-12 NOTE — Telephone Encounter (Signed)
Called Kennidy Spink-Bravo to discuss her cell free fetal DNA test results via [employer] [phone].  Mrs. Shadai Mcclane had Panorama testing through [facility].  Testing was offered because of advanced maternal age. We reviewed that these are within normal limits, showing a less than 1 in 10,000 risk for trisomies 21, 18 and 13, and monosomy X (Turner syndrome).  In addition, the risk for triploidy/vanishing twin and sex chromosome trisomies (47,XXX and 47,XXY) was also low risk.  We reviewed that this testing identifies > 99% of pregnancies with trisomy 71, trisomy 48, trisomy 27, sex chromosome trisomies (47,XXX and 47,XXY), and triploidy.  The detection rate for monosomy X is ~92%.  The false positive rate is <0.1% for all conditions. Testing was also consistent with female gender, which was disclosed to the patient, per her request.  She understands that this testing does not identify all genetic conditions.  All questions were answered to her satisfaction, she was encouraged to call with additional questions or concerns.  Quinn Plowman, MS Certified Genetic Counselor 02/12/2013 5:15 PM

## 2013-02-12 NOTE — Telephone Encounter (Signed)
Attempted to call patient regarding normal cell free DNA testing result with assistance of [employer] [phone]. Interpreter was unable to conference call from her line at the time of the phone call. I attempted to conference call. I was unable to connect the two phone calls. Patient's number went to voicemail. Unable to leave message, given that I was unable to connect back to the Spanish/English interpreter.   Clydie Braun Decklin Weddington 02/12/2013 4:40 PM

## 2013-02-23 ENCOUNTER — Ambulatory Visit (INDEPENDENT_AMBULATORY_CARE_PROVIDER_SITE_OTHER): Payer: Medicaid Other | Admitting: Obstetrics and Gynecology

## 2013-02-23 VITALS — BP 127/75 | Temp 97.1°F | Wt 116.0 lb

## 2013-02-23 DIAGNOSIS — O09899 Supervision of other high risk pregnancies, unspecified trimester: Secondary | ICD-10-CM

## 2013-02-23 DIAGNOSIS — O09892 Supervision of other high risk pregnancies, second trimester: Secondary | ICD-10-CM

## 2013-02-23 DIAGNOSIS — Z23 Encounter for immunization: Secondary | ICD-10-CM

## 2013-02-23 DIAGNOSIS — O09219 Supervision of pregnancy with history of pre-term labor, unspecified trimester: Secondary | ICD-10-CM

## 2013-02-23 LAB — POCT URINALYSIS DIP (DEVICE)
Glucose, UA: NEGATIVE mg/dL
Protein, ur: NEGATIVE mg/dL
Specific Gravity, Urine: 1.01 (ref 1.005–1.030)
Urobilinogen, UA: 0.2 mg/dL (ref 0.0–1.0)

## 2013-02-23 NOTE — Patient Instructions (Signed)
Sport and exercise psychologist  (Preterm Labor) El parto prematuro comienza antes de la semana 37 de [place]. La duracin de un embarazo normal es de 39 a 41 semanas.  CAUSAS  Generalmente no hay una causa que pueda identificarse. Sin embargo, una de las causas conocidas ms frecuentes son las infecciones. Las infecciones del tero, el cuello, la vagina, el lquido [place], la vejiga, los riones y Teacher, adult education de los pulmones (neumona) pueden hacer que el trabajo de parto se inicie. Otras causas son:   Infecciones urogenitales, como infecciones por hongos y vaginosis bacteriana.  Anormalidades uterinas (forma del tero, sptum uterino, fibromas, hemorragias en la placenta).  Un cuello que ha sido operado y se abre prematuramente.  Malformaciones del beb.  Gestaciones mltiples (mellizos, trillizos y ms).  Ruptura del saco amnitico. :Otros factores de riesgo del parto prematuro son   Historia previa de Sport and exercise psychologist.  Ruptura prematura de las [place].  La placenta cubre la apertura del cuello (placenta previa).  La placenta se separa del tero (abrupcin placentaria).  El cuello es demasiado dbil para contener al beb en el tero (cuello incompetente).  Hay mucho lquido en el saco amnitico (polihidramnios).  Consumo de drogas o hbito de fumar durante Firefighter.  No aumentar de peso lo suficiente durante el [employer].  Mujeres menores de 18 aos o [address] de [address] [place].  Nivel socioeconmico bajo.  Raza afroamericana. SNTOMAS  Los signos y sntomas son:   Clicos del tipo menstrual  Contracciones con un intervalo entre 30 y 70 segundos, comienzan a ser regulares, se hacen ms frecuentes y se hacen ms intensas y dolorosas.  Contracciones que comienzan en la parte superior del tero y se expanden hacia abajo, hacia la zona inferior del abdomen y la espalda.  Sensacin de presin en la pelvis o dolor en la espalda.  Aparece una secrecin acuosa o sanguinolenta por la  vagina. DIAGNSTICO  El diagnstico puede confirmarse:   Con un examen vaginal.  Ecografa del cuello.  Muestra (hisopado) de las secreciones crvico-vaginales. Estas muestras se analizan para buscar la presencia de fibronectina fetal. Esta protena que se encuentra en las secreciones del tero y se asocia con el parto prematuro.  Monitoreo fetal TRATAMIENTO  Segn el tiempo del Psychiatrist y otras [place], el mdico puede indicar reposo en cama. Si es necesario, le indicarn medicamentos para TEFL teacher las contracciones y apurar la maduracin de los pulmones del feto. Si el trabajo de parto se inicia antes de las 34 semanas de [place], se recomienda la hospitalizacin. El tratamiento depende de las condiciones en que se encuentre la madre y el beb.  PREVENCIN  Hay algunas cosas que [employer] puede hacer para disminuir el riesgo de trabajo de parto prematuro en futuros [employer]. Una mam puede:   Dejar de fumar.  Mantener un peso saludable y evitar sustancias qumicas y drogas innecesarias.  Controlar todo tipo de infeccin.  Informar al mdico si tiene una historia conocida de parto prematuro. Document Released: 07/31/2007 Document Revised: 07/16/2011 [facility] Patient Information 2014 [place], [place].

## 2013-02-23 NOTE — Progress Notes (Signed)
Pulse: 123

## 2013-02-23 NOTE — Progress Notes (Signed)
No concerns except eating too much. Prepregnant wt 100. Reassured.  Husband to get CF carrier testing. Flu shot. Hx LEEP and PTB 35 wks> will start 17-P next week and has anatomy and CL [place] scheduled 10/31> Consideration for cerclage if and cx shortening.

## 2013-02-26 ENCOUNTER — Encounter: Payer: Self-pay | Admitting: *Deleted

## 2013-03-02 ENCOUNTER — Encounter: Payer: Self-pay | Admitting: *Deleted

## 2013-03-02 ENCOUNTER — Ambulatory Visit (INDEPENDENT_AMBULATORY_CARE_PROVIDER_SITE_OTHER): Payer: Medicaid Other | Admitting: *Deleted

## 2013-03-02 VITALS — BP 126/69 | HR 113 | Temp 97.3°F | Wt 119.1 lb

## 2013-03-02 DIAGNOSIS — O09219 Supervision of pregnancy with history of pre-term labor, unspecified trimester: Secondary | ICD-10-CM

## 2013-03-02 DIAGNOSIS — O09212 Supervision of pregnancy with history of pre-term labor, second trimester: Secondary | ICD-10-CM

## 2013-03-02 MED ORDER — HYDROXYPROGESTERONE CAPROATE 250 MG/ML IM OIL
250.0000 mg | TOPICAL_OIL | INTRAMUSCULAR | Status: DC
  Administered 2013-03-02 – 2013-06-29 (×18): 250 mg via INTRAMUSCULAR

## 2013-03-06 ENCOUNTER — Ambulatory Visit (HOSPITAL_COMMUNITY)
Admission: RE | Admit: 2013-03-06 | Discharge: 2013-03-06 | Disposition: A | Discharge status: Home / Self Care | Payer: Medicaid Other | Source: Ambulatory Visit | Attending: Family Medicine | Admitting: Family Medicine

## 2013-03-06 ENCOUNTER — Encounter: Payer: Self-pay | Admitting: Family Medicine

## 2013-03-06 VITALS — BP 137/73 | HR 129

## 2013-03-06 DIAGNOSIS — IMO0002 Reserved for concepts with insufficient information to code with codable children: Secondary | ICD-10-CM | POA: Insufficient documentation

## 2013-03-06 DIAGNOSIS — O3442 Maternal care for other abnormalities of cervix, second trimester: Secondary | ICD-10-CM

## 2013-03-06 DIAGNOSIS — O4400 Placenta previa specified as without hemorrhage, unspecified trimester: Secondary | ICD-10-CM | POA: Insufficient documentation

## 2013-03-06 DIAGNOSIS — O3440 Maternal care for other abnormalities of cervix, unspecified trimester: Secondary | ICD-10-CM | POA: Insufficient documentation

## 2013-03-06 DIAGNOSIS — O0990 Supervision of high risk pregnancy, unspecified, unspecified trimester: Secondary | ICD-10-CM

## 2013-03-06 NOTE — Progress Notes (Signed)
Genetic Counseling  High-Risk Gestation Note  Appointment Date:  03/06/2013 Referred By: Reva Bores, MD Date of Birth:  May 10, 1975    Pregnancy History: [id number] Estimated Date of Delivery: 08/14/13 Estimated Gestational Age: [redacted]w[redacted]d Attending: Particia Nearing, MD    [name] was seen for genetic counseling regarding a maternal age of 37 y.o. 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 [employer], Angie provided interpretation during today's visit.   She was/They were counseled regarding maternal age and the association with risk for chromosome conditions due to nondisjunction with aging of the ova.   We reviewed chromosomes and nondisjunction.  She was counseled that the risk for aneuploidy decreases as gestational age increases, accounting for those pregnancies which spontaneously abort.  We specifically discussed Down syndrome (trisomy 43), trisomies 33 and 76, and sex chromosome aneuploidies (47,XXX and 47,XXY) including the common features and prognoses of each.   We also reviewed Mrs.Schauer' noninvasive prenatal screen (NIPS) result and the associated reduction in risks (< 1 in 10,000) for fetal Down syndrome, trisomy 68, trisomy 91, and monosomy X.  We reviewed that this testing identifies > 99% of pregnancies with trisomy 66, trisomy 4, trisomy 25, sex chromosome trisomies (47,XXX and 47,XXY), and triploidy.  The detection rate for monosomy X is ~92%.  The false positive rate is <0.1% for all conditions. Testing was also consistent with female gender. She understands that this testing does not identify all genetic conditions.  All questions were answered to her satisfaction, she was encouraged to call with additional questions or concerns.  She was counseled regarding other available screening and diagnostic options including ultrasound and amniocentesis.  The  risks, benefits, and limitations of each of these options were reviewed in detail.  After thoughtful consideration of these options, she declined amniocentesis.  Detailed ultrasound is scheduled for 03/20/13. She understands that screening tests cannot rule out all birth defects or genetic syndromes.   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 CF testing has not yet been performed for her husband.  Cystic fibrosis (CF) is a common genetic condition in the Caucasian population occurring in approximately 1 in 3,300 Caucasian births.  This means approximately 1 in 29 Caucasians is a CF carrier.  We discussed that approximately 1 in 46 individuals in the Hispanic population carries cystic fibrosis. Currently over 1,000 mutations have been identified in the CFTR gene.   We spent time reviewing the autosomal recessive inheritance of cystic fibrosis and the 25% chance, when both parents are carriers, to have a child with CF.  We discussed that there are thought to be thousands of mutations which can cause the CF gene to not function properly. CF results in thickened secretions in the lungs, digestive and reproductive systems.  This life-limiting condition is characterized by chronic respiratory infections necessitating daily chest therapies, pancreatic dysfunction disrupting the body's ability to break down food and extract nutrients as it should, and possible infertility in males.  With the advent of new therapies, the average lifespan for people with CF is in the 30's.  Symptoms can be considerably variable, and in some cases symptoms may be quite mild.  Sometimes, the specific mutations a person carries can indicate whether their symptoms may be associated with milder or more severe symptoms; this is not always the  case. Prior to carrier screening for the father of the pregnancy, the chance  for CF in the current pregnancy for the couple is approximately 1 in 184 (0.5%) given no known family history of CF for the father of the pregnancy and given that consanguinity was denied.   We discussed the option of carrier screening for the father of the pregnancy. Carrier screening assess for the most common disease causing CFTR mutations. Thus, a negative carrier screen would reduce but not eliminate the chance to be a carrier. In the case that both partners are identified to be carriers, prenatal diagnosis via amniocentesis would be available in the pregnancy. We also discussed that in [place], the newborn screening test will detect CF, but that carriers may come back as false positives.  Ms. Cuadra indicated that she would be interested in the father of the pregnancy pursuing CF carrier screening. He was not with the patient at the time of today's visit. We discussed that this testing can be performed through our office or her OB. The patient indicated that she would plan for the father of the pregnancy to accompany her to the next appointment in order to pursue CF carrier screening at that time. She indicated that she would not likely be interested in amniocentesis in the pregnancy.   Both family histories were reviewed and found to be noncontributory for birth defects, intellectual disability, and known genetic conditions. Without further information regarding the provided family history, an accurate genetic risk cannot be calculated. Further genetic counseling is warranted if more information is obtained.  Ms. AKEYA RYTHER denied exposure to environmental toxins or chemical agents. She denied the use of alcohol, tobacco or street drugs. She denied significant viral illnesses during the course of her pregnancy. Her medical and surgical histories were noncontributory.    I counseled Mrs. Laia B Foronda regarding the above risks and available options.  The approximate  face-to-face time with the genetic counselor was 35 minutes.  Quinn Plowman, MS Certified Genetic Counselor 03/06/2013

## 2013-03-09 ENCOUNTER — Ambulatory Visit (INDEPENDENT_AMBULATORY_CARE_PROVIDER_SITE_OTHER): Payer: Self-pay | Admitting: Obstetrics & Gynecology

## 2013-03-09 ENCOUNTER — Encounter: Payer: Self-pay | Admitting: Obstetrics & Gynecology

## 2013-03-09 VITALS — BP 110/70 | Wt 118.9 lb

## 2013-03-09 DIAGNOSIS — O09522 Supervision of elderly multigravida, second trimester: Secondary | ICD-10-CM

## 2013-03-09 DIAGNOSIS — O09529 Supervision of elderly multigravida, unspecified trimester: Secondary | ICD-10-CM

## 2013-03-09 DIAGNOSIS — O09892 Supervision of other high risk pregnancies, second trimester: Secondary | ICD-10-CM

## 2013-03-09 DIAGNOSIS — O09899 Supervision of other high risk pregnancies, unspecified trimester: Secondary | ICD-10-CM

## 2013-03-09 DIAGNOSIS — O09219 Supervision of pregnancy with history of pre-term labor, unspecified trimester: Secondary | ICD-10-CM

## 2013-03-09 DIAGNOSIS — Z141 Cystic fibrosis carrier: Secondary | ICD-10-CM

## 2013-03-09 LAB — POCT URINALYSIS DIP (DEVICE)
Bilirubin Urine: NEGATIVE
Hgb urine dipstick: NEGATIVE
Ketones, ur: NEGATIVE mg/dL
Leukocytes, UA: NEGATIVE
Protein, ur: NEGATIVE mg/dL
Specific Gravity, Urine: <=1.005 (ref 1.005–1.030)
Urobilinogen, UA: 0.2 mg/dL (ref 0.0–1.0)
pH: 6.5 (ref 5.0–8.0)

## 2013-03-09 NOTE — Progress Notes (Signed)
[place] on November 14th to eval cervical length and anatomy.  17 p today. CF screening for husband at next MFM visit.

## 2013-03-09 NOTE — Progress Notes (Signed)
Pulse: 88 Patient reports that she has headaches since she had her first 17P she thinks it may be related.

## 2013-03-16 ENCOUNTER — Telehealth: Payer: Self-pay

## 2013-03-16 ENCOUNTER — Ambulatory Visit: Payer: Medicaid Other

## 2013-03-16 DIAGNOSIS — G43909 Migraine, unspecified, not intractable, without status migrainosus: Secondary | ICD-10-CM

## 2013-03-16 MED ORDER — CYCLOBENZAPRINE HCL 10 MG PO TABS: 10.0000 mg | ORAL_TABLET | Freq: Three times a day (TID) | ORAL | Status: DC | PRN

## 2013-03-16 NOTE — Telephone Encounter (Addendum)
Pt. Called front desk to speak with nurse. Spoke with pt. Via interpreter. Pt. States she has been having a migraine for the last 5 days and it is so bad she cannot get out of bed and for that reason was unable to come to todays appointment for 17P. Pt.  States she has been taking tylenol 500mg  q12 hours without relief. Talked to Dr. Shawnie Pons who stated to order. Pt. 10mg  flexeril in hopes of relieving pain enough that patient can come in and if needed be seen and pain medication prescribed. Called pt with Spanish interpreter,  Trinna Post, and informed her that provider prescribed flexeril and if that does not relieve the pain then to please call and make an appt.  Pt stated understanding.

## 2013-03-18 ENCOUNTER — Ambulatory Visit (INDEPENDENT_AMBULATORY_CARE_PROVIDER_SITE_OTHER): Payer: Self-pay | Admitting: General Practice

## 2013-03-18 VITALS — BP 105/62 | HR 87 | Temp 97.4°F | Ht 65.0 in | Wt 120.1 lb

## 2013-03-18 DIAGNOSIS — O09219 Supervision of pregnancy with history of pre-term labor, unspecified trimester: Secondary | ICD-10-CM

## 2013-03-18 DIAGNOSIS — Z7189 Other specified counseling: Secondary | ICD-10-CM

## 2013-03-20 ENCOUNTER — Encounter: Payer: Self-pay | Admitting: Family Medicine

## 2013-03-20 ENCOUNTER — Ambulatory Visit (HOSPITAL_COMMUNITY)
Admission: RE | Admit: 2013-03-20 | Discharge: 2013-03-20 | Disposition: A | Discharge status: Home / Self Care | Payer: Medicaid Other | Source: Ambulatory Visit | Attending: Family Medicine | Admitting: Family Medicine

## 2013-03-20 VITALS — BP 115/70 | HR 117 | Wt 122.0 lb

## 2013-03-20 DIAGNOSIS — Z0489 Encounter for examination and observation for other specified reasons: Secondary | ICD-10-CM

## 2013-03-20 DIAGNOSIS — O09529 Supervision of elderly multigravida, unspecified trimester: Secondary | ICD-10-CM | POA: Insufficient documentation

## 2013-03-20 DIAGNOSIS — O3442 Maternal care for other abnormalities of cervix, second trimester: Secondary | ICD-10-CM

## 2013-03-20 DIAGNOSIS — Z1389 Encounter for screening for other disorder: Secondary | ICD-10-CM | POA: Insufficient documentation

## 2013-03-20 DIAGNOSIS — O0990 Supervision of high risk pregnancy, unspecified, unspecified trimester: Secondary | ICD-10-CM

## 2013-03-20 DIAGNOSIS — Z363 Encounter for antenatal screening for malformations: Secondary | ICD-10-CM | POA: Insufficient documentation

## 2013-03-20 DIAGNOSIS — O3440 Maternal care for other abnormalities of cervix, unspecified trimester: Secondary | ICD-10-CM | POA: Insufficient documentation

## 2013-03-23 ENCOUNTER — Ambulatory Visit (INDEPENDENT_AMBULATORY_CARE_PROVIDER_SITE_OTHER): Payer: Self-pay | Admitting: Obstetrics & Gynecology

## 2013-03-23 VITALS — BP 109/74 | Temp 97.1°F | Wt 119.7 lb

## 2013-03-23 DIAGNOSIS — O09522 Supervision of elderly multigravida, second trimester: Secondary | ICD-10-CM

## 2013-03-23 DIAGNOSIS — O0990 Supervision of high risk pregnancy, unspecified, unspecified trimester: Secondary | ICD-10-CM

## 2013-03-23 DIAGNOSIS — O09529 Supervision of elderly multigravida, unspecified trimester: Secondary | ICD-10-CM

## 2013-03-23 DIAGNOSIS — O09219 Supervision of pregnancy with history of pre-term labor, unspecified trimester: Secondary | ICD-10-CM

## 2013-03-23 LAB — POCT URINALYSIS DIP (DEVICE)
Bilirubin Urine: NEGATIVE
Glucose, UA: NEGATIVE mg/dL
Hgb urine dipstick: NEGATIVE
Protein, ur: NEGATIVE mg/dL
Specific Gravity, Urine: 1.025 (ref 1.005–1.030)
Urobilinogen, UA: 0.2 mg/dL (ref 0.0–1.0)
pH: 6 (ref 5.0–8.0)

## 2013-03-23 NOTE — Progress Notes (Signed)
Normal anatomy scan.  Marginal previa resolved.  No other SOB/CP reported, will continue to follow report of rapid heart beat.  Continue weekly 17P, no need for MD visit until 4 weeks unless indicated earlier.  No other complaints or concerns.  Routine obstetric precautions reviewed.

## 2013-03-23 NOTE — Patient Instructions (Signed)
Regrese a la clinica cuando tenga su cita. Si tiene problemas o preguntas, llama a la clinica o vaya a la sala de emergencia al [facility].    

## 2013-03-23 NOTE — Progress Notes (Signed)
P= 110 Pt. C/o of waking up this morning and noticing a rapid heart beat/anxiousness, lasted for two minutes and went away when she had a small glass of coke.

## 2013-03-30 ENCOUNTER — Ambulatory Visit (INDEPENDENT_AMBULATORY_CARE_PROVIDER_SITE_OTHER): Payer: Self-pay | Admitting: *Deleted

## 2013-03-30 VITALS — BP 113/70 | HR 105 | Temp 97.8°F | Wt 122.6 lb

## 2013-03-30 DIAGNOSIS — O09219 Supervision of pregnancy with history of pre-term labor, unspecified trimester: Secondary | ICD-10-CM

## 2013-03-30 DIAGNOSIS — O09212 Supervision of pregnancy with history of pre-term labor, second trimester: Secondary | ICD-10-CM

## 2013-04-06 ENCOUNTER — Ambulatory Visit (INDEPENDENT_AMBULATORY_CARE_PROVIDER_SITE_OTHER): Payer: Self-pay

## 2013-04-06 ENCOUNTER — Ambulatory Visit: Payer: Medicaid Other

## 2013-04-06 VITALS — BP 109/66 | HR 104 | Wt 123.4 lb

## 2013-04-06 DIAGNOSIS — O09212 Supervision of pregnancy with history of pre-term labor, second trimester: Secondary | ICD-10-CM

## 2013-04-06 DIAGNOSIS — O09219 Supervision of pregnancy with history of pre-term labor, unspecified trimester: Secondary | ICD-10-CM

## 2013-04-13 ENCOUNTER — Ambulatory Visit (INDEPENDENT_AMBULATORY_CARE_PROVIDER_SITE_OTHER): Payer: Medicaid Other | Admitting: *Deleted

## 2013-04-13 VITALS — BP 107/64 | HR 97 | Temp 97.6°F | Wt 124.2 lb

## 2013-04-13 DIAGNOSIS — O09219 Supervision of pregnancy with history of pre-term labor, unspecified trimester: Secondary | ICD-10-CM

## 2013-04-13 DIAGNOSIS — O09212 Supervision of pregnancy with history of pre-term labor, second trimester: Secondary | ICD-10-CM

## 2013-04-16 ENCOUNTER — Inpatient Hospital Stay (HOSPITAL_COMMUNITY)
Admission: AD | Admit: 2013-04-16 | Discharge: 2013-04-16 | Disposition: A | Discharge status: Home / Self Care | Payer: Medicaid Other | Source: Ambulatory Visit | Attending: Obstetrics & Gynecology | Admitting: Obstetrics & Gynecology

## 2013-04-16 ENCOUNTER — Encounter (HOSPITAL_COMMUNITY): Payer: Self-pay | Admitting: *Deleted

## 2013-04-16 DIAGNOSIS — M62830 Muscle spasm of back: Secondary | ICD-10-CM

## 2013-04-16 DIAGNOSIS — O0990 Supervision of high risk pregnancy, unspecified, unspecified trimester: Secondary | ICD-10-CM

## 2013-04-16 DIAGNOSIS — M545 Low back pain, unspecified: Secondary | ICD-10-CM | POA: Insufficient documentation

## 2013-04-16 DIAGNOSIS — O99891 Other specified diseases and conditions complicating pregnancy: Secondary | ICD-10-CM | POA: Insufficient documentation

## 2013-04-16 LAB — URINE MICROSCOPIC-ADD ON

## 2013-04-16 LAB — URINALYSIS, ROUTINE W REFLEX MICROSCOPIC
Glucose, UA: NEGATIVE mg/dL
Hgb urine dipstick: NEGATIVE
Protein, ur: NEGATIVE mg/dL
pH: 5.5 (ref 5.0–8.0)

## 2013-04-16 MED ORDER — CYCLOBENZAPRINE HCL 5 MG PO TABS
5.0000 mg | ORAL_TABLET | Freq: Once | ORAL | Status: AC
  Administered 2013-04-16: 5 mg via ORAL
  Filled 2013-04-16: qty 1

## 2013-04-16 NOTE — MAU Note (Signed)
Pt states she started having lower back pain last night about 2200.  Denies vaginal bleeding or LOF.  Good fetal movement.

## 2013-04-16 NOTE — MAU Provider Note (Signed)
History     CSN: [id number]  Arrival date and time: 04/16/13 1207   First Provider Initiated Contact with Patient 04/16/13 1304      Chief Complaint  Patient presents with  . Back Pain   HPI Pt is 37 yo G3P1102 at [redacted]w[redacted]d who presents with low back pain since last night. [name] states that [name] was sitting watching TV around 930 pm last night and when [name] turned left to get up [name] felt a lot of pain across her lower back and was not able to stand up easily. The pain has continued has a strong, cramping pain that is worse with twisting, sitting, & standing. [name] denies any strenuous work, lifting, or fall. [name] is not having any abdominal pain. [name] does have some urinary frequency without dysuria or urgency. - vaginal bleeding, - leaking of fluid, - contractions, + fetal movement  OB History   Grav Para Term Preterm Abortions TAB SAB Ect Mult Living   3 2 1 1  0 0 0 0 0 2      Past Medical History  Diagnosis Date  . Migraines   . Migraine   . Abnormal Pap smear     Past Surgical History  Procedure Laterality Date  . Leep  2005    Family History  Problem Relation Age of Onset  . Hypertension Mother     History  Substance Use Topics  . Smoking status: Never Smoker   . Smokeless tobacco: Never Used  . Alcohol Use: No    Allergies: No Known Allergies  Facility-administered medications prior to admission  Medication Dose Route Frequency Provider Last Rate Last Dose  . hydroxyprogesterone caproate (DELALUTIN) 250 mg/mL injection 250 mg  250 mg Intramuscular Weekly [name], CNM   250 mg at 04/13/13 3086   Prescriptions prior to admission  Medication Sig Dispense Refill  . Prenatal Vit-Fe Fumarate-FA (PRENATAL MULTIVITAMIN) TABS tablet Take 1 tablet by mouth daily at 12 noon.        Review of Systems  Constitutional: Negative for fever, chills and malaise/fatigue.  HENT: Negative for congestion and sore throat.   Eyes: Negative for blurred vision, double vision and  photophobia.  Respiratory: Negative for cough, shortness of breath and wheezing.   Cardiovascular: Negative for chest pain, palpitations and leg swelling.  Gastrointestinal: Negative for nausea, vomiting, abdominal pain, diarrhea, constipation and blood in stool.  Genitourinary: Positive for urgency. Negative for dysuria, frequency, hematuria and flank pain.  Musculoskeletal: Positive for back pain. Negative for falls, joint pain and neck pain.  Skin: Negative for itching and rash.  Neurological: Negative for dizziness, tingling, tremors, focal weakness, weakness and headaches.   Physical Exam   Blood pressure 120/69, pulse 106, temperature 98.2 F (36.8 C), temperature source Oral, resp. rate 18, height 5\' 6"  (1.676 m), weight 56.473 kg (124 lb 8 oz), last menstrual period 11/07/2012, SpO2 100.00%.  Physical Exam  Nursing note and vitals reviewed. Constitutional: [name] is oriented to person, place, and time. [name] appears well-developed and well-nourished. [name] appears distressed.  No distress while lying, but lots of grimacing and tears up with sitting up for back exam.  HENT:  Head: Normocephalic and atraumatic.  Nose: Nose normal.  Eyes: Conjunctivae are normal. Right eye exhibits no discharge. Left eye exhibits no discharge. No scleral icterus.  Neck: Normal range of motion. Neck supple.  Cardiovascular: Normal rate, regular rhythm, normal heart sounds and intact distal pulses.   No murmur heard. Respiratory: Effort normal and  breath sounds normal. [name] has no wheezes.  GI: Soft. Bowel sounds are normal. [name] exhibits no distension. There is no tenderness. There is no guarding.  Genitourinary: No vaginal discharge found.  Musculoskeletal: Normal range of motion. [name] exhibits no edema and no tenderness.  Bilateral paravertebral muscle spasm palpable just above sacrum on exam. No midline back pain or hip pain.  Neurological: [name] is alert and oriented to person, place, and time. [name] has  normal reflexes.  Skin: Skin is warm and dry. No rash noted. [name] is not diaphoretic.  Psychiatric: [name] has a normal mood and affect. Her behavior is normal.  Dilation: Closed Effacement (%): Thick Cervical Position: Posterior Station: Ballotable Exam by:: Dr Hollice Gong, CNM Toco: 2 contractions in 1 hour FHR: 150s by doppler  MAU Course  Procedures  Results for orders placed during the hospital encounter of 04/16/13 (from the past 24 hour(s))  URINALYSIS, ROUTINE W REFLEX MICROSCOPIC     Status: Abnormal   Collection Time    04/16/13 12:15 PM      Result Value Range   Color, Urine STRAW (*) YELLOW   APPearance CLEAR  CLEAR   Specific Gravity, Urine <1.005 (*) 1.005 - 1.030   pH 5.5  5.0 - 8.0   Glucose, UA NEGATIVE  NEGATIVE mg/dL   Hgb urine dipstick NEGATIVE  NEGATIVE   Bilirubin Urine NEGATIVE  NEGATIVE   Ketones, ur NEGATIVE  NEGATIVE mg/dL   Protein, ur NEGATIVE  NEGATIVE mg/dL   Urobilinogen, UA 0.2  0.0 - 1.0 mg/dL   Nitrite NEGATIVE  NEGATIVE   Leukocytes, UA SMALL (*) NEGATIVE  URINE MICROSCOPIC-ADD ON     Status: None   Collection Time    04/16/13 12:15 PM      Result Value Range   Squamous Epithelial / LPF RARE  RARE   WBC, UA 3-6  <3 WBC/hpf   RBC / HPF 3-6  <3 RBC/hpf   Bacteria, UA RARE  RARE    Assessment and Plan  Pt is 37 yo G3P1102 at [redacted]w[redacted]d with acute onset of lower back pain last night consistent with muscle spasm.   - Flexeril given in MAU with improvement in pain - Has flexeril at home as previously prescribed for headache in clinic that [name] has not taken in 2 weeks - Ok to use tylenol 1000mg  every 6 hours as needed - Instructions for ice, heat, rest given - Continue prenatal care as recommended by primary provider  Pior, Jearld Lesch 04/16/2013, 1:29 PM   I have seen this patient and agree with the above resident's note.  LEFTWICH-KIRBY, Keidrick Murty Certified Nurse-Midwife

## 2013-04-20 ENCOUNTER — Encounter: Payer: Self-pay | Admitting: Family Medicine

## 2013-04-20 ENCOUNTER — Ambulatory Visit (INDEPENDENT_AMBULATORY_CARE_PROVIDER_SITE_OTHER): Payer: Self-pay | Admitting: Family Medicine

## 2013-04-20 VITALS — BP 125/68 | Temp 97.9°F | Wt 125.2 lb

## 2013-04-20 DIAGNOSIS — O09212 Supervision of pregnancy with history of pre-term labor, second trimester: Secondary | ICD-10-CM

## 2013-04-20 DIAGNOSIS — O09219 Supervision of pregnancy with history of pre-term labor, unspecified trimester: Secondary | ICD-10-CM

## 2013-04-20 LAB — POCT URINALYSIS DIP (DEVICE)
Bilirubin Urine: NEGATIVE
Glucose, UA: NEGATIVE mg/dL
Ketones, ur: NEGATIVE mg/dL
Nitrite: NEGATIVE
Protein, ur: NEGATIVE mg/dL
Urobilinogen, UA: 0.2 mg/dL (ref 0.0–1.0)

## 2013-04-20 NOTE — Progress Notes (Signed)
P= 98 Pt. States she was in the hospital 12/11 for muscle cramps and spasms, was given flexeril that day but not a prescription. States she is still having them occasionally and would like a prescription.

## 2013-04-20 NOTE — Progress Notes (Signed)
Large leuks in urine--reports vaginal d/c without irritation--will check urine culture. Continue 17-P Seen in MAU for pre-term labor eval with closed cervix.

## 2013-04-20 NOTE — Patient Instructions (Signed)
Segundo trimestre del embarazo  (Second Trimester of Pregnancy) El segundo trimestre del embarazo se extiende desde la semana 13 hasta la semana 28, del 4 al 6 mes. En general, es el momento del embarazo en el que se sentir mejor. Su organismo se ha adaptado a estar embarazada y comienza a sentirse fsicamente mejor. En general las nuseas matutinas han disminuido o han desaparecido completamente. El segundo trimestre es tambin la poca en la que el feto se desarrolla rpidamente. Hacia el final del sexto mes, el beb mide aproximadamente 9 pulgadas (23 cm) y pesa alrededor de 1  libras (700 g). Es probable que sienta mover al beb (dar pataditas) entre las 18 y 20 semanas del embarazo.  CAMBIOS CORPORALES  Su organismo atravesar numerosos cambios durante el embarazo. Los cambios varan de una mujer a otra.   Seguir aumentando de peso. Notar que la parte baja del abdomen se ensancha.  Podrn aparecer las primeras estras en las caderas, abdomen y mamas.  Es posible que sienta cefaleas, que se pueden aliviar con los medicamentos que su mdico le autorice a utilizar.  Tendr necesidad de orinar con ms frecuencia porque el feto est presionando en la vejiga.  Como consecuencia del embarazo, podr sentir acidez estomacal continuamente.  Podr estar constipada ya que ciertas hormonas hacen que los msculos que empujan los desechos a travs de los intestinos trabajen ms lentamente.  Pueden aparecer hemorroides o abultarse las venas (venas varicosas).  El dolor de espalda se debe al aumento de peso y a que las hormonas del embarazo relajan las articulaciones entre los huesos de la pelvis y a la modificacin del peso y a los msculos que soportan el equilibrio.  Sus mamas seguirn desarrollndose y estarn ms sensibles.  Las encas pueden sangrar y estar sensibles al cepillado y al hilo dental.  Pueden aparecer en el rostro zonas oscuras o manchas (cloasma, mscara del embarazo). Esto  desaparece despus del nacimiento del beb.  Es posible que se formeuna lnea oscura desde el ombligo a la zona del pubis (linea nigra). Esto desaparece despus del nacimiento del beb. QU DEBE ESPERAR EN LAS CONSULTAS PRENATALES  Durante una visita prenatal de rutina:   La pesarn para verificar que usted y el feto se encuentran dentro de los lmites normales.  Le tomarn la presin arterial.  Le medirn el abdomen para verificar el desarrollo del beb.  Escucharn los latidos fetales.  Se evaluarn los resultados de los estudios realizados en visitas anteriores. El mdico le preguntar:   Cmo se siente.  Si siente los movimientos del beb.  Si tiene sntomas anormales, como prdida de lquido, sangrado, dolores de cabeza intensos o clicos abdominales.  Si tiene alguna duda. Otros estudios que podrn realizarse durante el segundo trimestre son:   Anlisis de sangre para evaluar:  Niveles bajos de hierro (anemia).  Diabetes gestacional (entre las 24 y las 28 semanas).  Anticuerpos Rh.  Anlisis de orina para detectar infecciones, diabetes o protenas en la orina.  Una ecografa para confirmar si el crecimiento y el desarrollo del beb son los adecuados.  Una amniocentesis para diagnosticar posibles problemas genticos.  Estudios del feto para descartar espina bfida y sndrome de Down. INSTRUCCIONES PARA EL CUIDADO EN EL HOGAR   Evite fumar, consumir hierbas, beber alcohol y utilizar frmacos que no ne hayan recetado. Estas sustancias qumicas afectan la formacin y el desarrollo del beb.  Siga las indicaciones del profesional con respecto a como tomar los medicamentos. Durante el embarazo, hay   medicamentos que son seguros y otros no lo son.  Realice actividad fsica slo segn las indicaciones del mdico. Sentir clicos uterinos es el mejor signo para detener la actividad fsica.  Contine haciendo comidas regulares y sanas.  Use un sostn que le brinde buen  soporte si sus mamas estn sensibles.  No utilice la baera con agua caliente, baos turcos y saunas.  Colquese el cinturn de seguridad cuando conduzca.  Evite comer carne cruda y el contacto con los utensilios y desperdicios de los gatos. Estos elementos contienen grmenes que pueden causar defectos de nacimiento en el beb.  Tome las vitaminas indicadas para la etapa prenatal.  Pruebe un laxante (si el mdico la autoriza) si tiene constipacin. Consuma ms alimentos ricos en fibra, como vegetales y frutas frescos y cereales enteros. Beba gran cantidad de lquido para mantener la orina de tono claro o amarillo plido.  Tome baos de agua tibia para calmar el dolor o las molestias causadas por las hemorroides. Use una crema para las hemorroides si el mdico la autoriza.  Si tiene venas varicosas, use medias de soporte. Eleve los pies durante 15 minutos, 3 o 4 veces por da. Limite el consumo de sal en su dieta.  Evite levantar objetos pesados, use zapatos de tacones bajos y mantenga una buena postura.  Descanse con las piernas elevadas si tiene calambres o dolor de cintura.  Visite a su dentista si no lo ha hecho durante el embarazo. Use un cepillo de dientes blando para higienizarse los dientes y use suavemente el hilo dental.  Puede continuar su vida sexual siempre que el mdico la autorice.  Concurra a todas las visitas prenatales segn las indicaciones de su mdico. SOLICITE ATENCIN MDICA SI:   Tiene mareos.  Siente clicos leves, presin en la pelvis o dolor persistente en el abdomen.  Tiene nuseas o vmitos o diarrea persistentes.  Observa una secrecin vaginal con mal olor.  Siente dolor al orinar. SOLICITE ATENCIN MDICA DE INMEDIATO SI:   Tiene fiebre.  Pierde lquido por la vagina.  Tiene sangrando o pequeas prdidas vaginales.  Siente dolor intenso o clicos en el abdomen.  Sube o baja de peso rpidamente.  Tiene dificultad para respirar y le duele  pecho.  Sbitamente se le hincha el rostro, las manos, los tobillos, los pies o las piernas de manera extrema.  No ha sentido los movimientos del beb durante una hora.  Siente un dolor de cabeza intenso que no se alivia con medicamentos.  Su visin se modifica. Document Released: 01/31/2005 Document Revised: 12/24/2012 [employer] Patient Information 2014 [employer].  Lactancia materna  (Breastfeeding)  El cambio hormonal durante el embarazo produce el desarrollo del tejido mamario y un aumento en el nmero y tamao de los conductos galactforos. La hormona prolactina permite que las protenas, los azcares y las grasas de la sangre produzcan la leche materna en las glndulas productoras de leche. La hormona progesterona impide que la leche materna sea liberada antes del nacimiento del beb. Despus del nacimiento del beb, su nivel de progesterona disminuye permitiendo que la leche materna sea liberada. Pensar en el beb, as como la succin o el llanto, pueden estimular la liberacin de leche de las glndulas productoras de leche.  La decisin de amamantar (lactar) es una de las mejores opciones que usted puede hacer para usted y su beb. La informacin que sigue da una breve resea de los beneficios, as como otras caractersticas importantes que debe saber sobre la lactancia materna.  LOS BENEFICIOS   DE AMAMANTAR  Para el beb   La primera leche (calostro) ayuda al mejor funcionamiento del sistema digestivo del beb.   La leche tiene anticuerpos que provienen de la madre y que ayudan a prevenir las infecciones en el beb.   El beb tiene una menor incidencia de asma, alergias y del sndrome de muerte sbita del lactante (SMSL).   Los nutrientes de la leche materna son mejores para el beb que la leche maternizada.  La leche materna mejora el desarrollo cerebral del beb.   Su beb tendr menos gases, clicos y estreimiento.  Es menos probable que el beb desarrolle otras  enfermedades, como obesidad infantil, asma o diabetes mellitus. Para usted   La lactancia materna favorece el desarrollo de un vnculo muy especial entre la madre y el beb.   Es ms conveniente, siempre disponible, a la temperatura adecuada y econmica.   La lactancia materna ayuda a quemar caloras y a perder el peso ganado durante el embarazo.   Hace que el tero se contraiga ms rpidamente a su tamao normal y disminuye el sangrado despus del parto.   Las madres que amamantan tienen menos riesgo de desarrollar osteoporosis o cncer de mama o de ovario en el futuro.  FRECUENCIA DEL AMAMANTAMIENTO   Un beb sano, nacido a trmino, puede amamantarse con tanta frecuencia como cada hora, o espaciar las comidas cada tres horas. La frecuencia en la lactancia varan de un beb a otro.   Los recin nacidos deben ser alimentados por lo menos cada 2-3 horas durante el da y cada 4-5 horas durante la noche. Usted debe amamantarlo un mnimo de 8 tomas en un perodo de 24 horas.  Despierte al beb para amamantarlo si han pasado 3-4 horas desde la ltima comida.  Amamante cuando sienta la necesidad de reducir la plenitud de sus senos o cuando el beb muestre signos de hambre. Las seales de que el beb puede tener hambre son:  Aumenta su estado de alerta o vigilancia.  Se estira.  Mueve la cabeza de un lado a otro.  Mueve la cabeza y abre la boca cuando se le toca la mejilla o la boca (reflejo de succin).  Aumenta las vocalizaciones, tales como sonidos de succin, relamerse los labios, arrullos, suspiros, o chirridos.  Mueve la mano hacia la boca.  Se chupa con ganas los dedos o las manos.  Agitacin.  Llanto intermitente.  Los signos de hambre extrema requerirn que lo calme y lo consuele antes de tratar de alimentarlo. Los signos de hambre extrema son:  Agitacin.  Llanto fuerte e intenso.  Gritos.  El amamantamiento frecuente la ayudar a producir ms leche y a  prevenir problemas de dolor en los pezones e hinchazn de las mamas.  LACTANCIA MATERNA   Ya sea que se encuentre acostada o sentada, asegrese que el abdomen del beb est enfrente el suyo.   Sostenga la mama con el pulgar por arriba y los otros 4 dedos por debajo del pezn. Asegrese que sus dedos se encuentren lejos del pezn y de la boca del beb.   Empuje suavemente los labios del beb con el pezn o con el dedo.   Cuando la boca del beb se abra lo suficiente, introduzca el pezn y la zona oscura que lo rodea (areola) tanto como le sea posible dentro de la boca.  Debe haber ms areola visible por arriba del labio superior que por debajo del labio inferior.  La lengua del beb debe estar entre la enca   inferior y el seno.  Asegrese de que la boca del beb est en la posicin correcta alrededor del pezn (prendida). Los labios del beb deben crear un sello sobre su pecho.  Las seales de que el beb se ha prendido eficazmente al pezn son:  Tironea o succiona sin dolor.  Se escucha que traga entre las succiones.  No hace ruidos ni chasquidos.  Hay movimientos musculares por arriba y por delante de sus odos al succionar.  El beb debe succionar unos 2-3 minutos para que salga la leche. Permita que el nio se alimente en cada mama todo lo que desee. Alimente al beb hasta que se desprenda o se quede dormido en el primer pecho y luego ofrzcale el segundo pecho.  Las seales de que el beb est lleno y satisfecho son:  Disminuye gradualmente el nmero de succiones o no succiona.  Se queda dormido.  Extiende o relaja su cuerpo.  Retiene una pequea cantidad de leche en la boca.  Se desprende del pecho por s mismo.  Los signos de una lactancia materna eficaz son:  Los senos han aumentado la firmeza, el peso y el tamao antes de la alimentacin.  Son ms blandos despus de amamantar.  Un aumento del volumen de leche, y tambin el cambio de su consistencia y color  se producen hacia el quinto da de lactancia materna.  La congestin mamaria se alivia al dar de mamar.  Los pezones no duelen, ni estn agrietados ni sangran.  De ser necesario, interrumpa la succin poniendo su dedo en la esquina de la boca del beb y deslizando el dedo entre sus encas. A continuacin, retire la mama de su boca.  Es comn que los bebs regurgiten un poco despus de comer.  A menudo los bebs tragan aire al alimentarse. Esto puede hacer que se sienta molesto. Hacer eructar al beb al cambiar de pecho puede ser de ayuda.  Se recomiendan suplementos de vitamina D para los bebs que reciben slo leche materna.  Evite el uso del chupete durante las primeras 4 a 6 semanas de vida.  Evite la alimentacin suplementaria con agua, frmula o jugo en lugar de la leche materna. La leche materna es todo el alimento que el beb necesita. No es necesario que el nio ingiera agua o preparados de bibern. Sus pechos producirn ms leche si se evita la alimentacin suplementaria durante las primeras semanas. COMO SABER SI EL BEB OBTIENE LA SUFICIENTE LECHE MATERNA  Preguntarse si el beb obtiene la cantidad suficiente de leche es una preocupacin frecuente entre las madres. Puede asegurarse que el beb tiene la leche suficiente si:   El beb succiona activamente y usted escucha que traga.   El beb parece estar relajado y satisfecho despus de mamar.   El nio se alimenta al menos 8 a 12 veces en 24 horas.  Durante los primeros 3 a 5 das de vida:  Moja 3-5 paales en 24 horas. La materia fecal debe ser blanda y amarillenta.  Tiene al menos 3 a 4 deposiciones en 24 horas. La materia fecal debe ser blanda y amarillenta.  A los 5-7 das de vida, el beb debe tener al menos 3-6 deposiciones en 24 horas. La materia fecal debe ser grumosa y amarilla a los 5 das de vida.  Su beb tiene una prdida de peso menor a 7al 10% durante los primeros 3 das de vida.  El beb no pierde peso  despus de 3-7 das de vida.  El beb debe   aumentar 4 a 6 libras (120 a 170 gr.) por semana despus de los 4 das de vida.  Aumenta de peso a los 5 das de vida y vuelve al peso del nacimiento dentro de las 2 semanas. CONGESTIN MAMARIA  Durante la primera semana despus del parto, usted puede experimentar hinchazn en las mamas (congestin mamaria). Al estar congestionadas, las mamas se sienten pesadas, calientes o sensibles al tacto. El pico de la congestin ocurre a las 24 -48 horas despus del parto.   La congestin puede disminuirse:  Continuando con la lactancia materna.  Aumentando la frecuencia.  Tomando duchas calientes o aplicando calor hmedo en los senos antes de cada comida. Esto aumenta la circulacin y ayuda a que la leche fluya.   Masajeando suavemente el pecho antes y durante las comidas. Con las yemas de los dedos, masajee la pared del pecho hacia el pezn en un movimiento circular.   Asegurarse de que el beb vaca al menos uno de sus pechos en cada alimentacin. Tambin ayuda si comienza la siguiente toma en el otro seno.   Extraiga manualmente o con un sacaleches las mamas para vaciar los pechos si el beb tiene sueo o no se aliment bien. Tambin puede extraer la leche cuando vuelva a trabajar o si siente que se estn congestionando las mamas.  Asegrese de que el beb se prende y est bien colocado durante la lactancia. Si sigue estas indicaciones, la congestin debe mejorar en 24 a 48 horas. Si an tiene dificultades, consulte a su asesor en lactancia.  CUDESE USTED MISMA  Cuide sus mamas.   Bese o dchese diariamente.   Evite usar jabn en los pezones.   Use un sostn de soporte Evite el uso de sostenes con aro.  Seque al aire sus pezones durante 3-4 minutos despus de cada comida.   Utilice slo apsitos de algodn en el sostn para absorber las prdidas de leche. La prdida de un poco de leche materna entre las comidas es normal.   Use  solamente lanolina pura en sus pezones despus de amamantar. Usted no tiene que lavarla antes de alimentar al beb. Otra opcin es sacarse unas gotas de leche y masajear suavemente los pezones.  Continuar con los autocontroles de la mama. Cudese.   Consuma alimentos saludables. Alterne 3 comidas con 3 colaciones.  Evite los alimentos que usted nota que perjudican al beb.  Beba leche, jugos de fruta y agua para satisfacer su sed (aproximadamente 8 vasos al da).   Descanse con frecuencia, reljese y tome sus vitaminas prenatales para evitar la fatiga, el estrs y la anemia.  Evite masticar y fumar tabaco.  Evite el consumo de alcohol y drogas.  Tome medicamentos de venta libre y recetados tal como le indic su mdico o farmacutico. Siempre debe consultar con su mdico o farmacutico antes de tomar cualquier medicamento, vitamina o suplemento de hierbas.  Sepa que durante la lactancia puede quedar embarazada. Si lo desea, hable con su mdico acerca de la planificacin familiar y los mtodos anticonceptivos seguros que puede utilizar durante la lactancia. SOLICITE ATENCIN MDICA SI:   Usted siente que quiere dejar de amamantar o se siente frustrada con la lactancia.  Siente dolor en los senos o en los pezones.  Sus pezones estn agrietados o sangran.  Sus pechos estn irritados, sensibles o calientes.  Tiene un rea hinchada en cualquiera de los senos.  Siente escalofros o fiebre.  Tiene nuseas o vmitos.  Observa un drenaje en los pezones.  Sus   mamas no se llenan antes de amamantarlo al 5to da despus del parto.  Se siente triste y deprimida.  El nio est demasiado somnoliento como para comer.  El nio tiene problemas para respirar.   Moja menos de 3 paales en 24 horas.  Mueve el intestino menos de 3 veces en 24 horas.  La piel del beb o la parte blanca de sus ojos est ms amarilla.   El beb no ha aumentado de peso a los 5 das de vida. ASEGRESE DE  QUE:   Comprende estas instrucciones.  Controlar su enfermedad.  Solicitar ayuda de inmediato si no mejora o si empeora. Document Released: 04/23/2005 Document Revised: 01/16/2012 [employer] Patient Information 2014 [employer].  

## 2013-04-22 LAB — CULTURE, OB URINE

## 2013-04-24 NOTE — MAU Provider Note (Signed)
Attestation of Attending Supervision of Advanced Practitioner (CNM/NP): Evaluation and management procedures were performed by the Advanced Practitioner under my supervision and collaboration. I have reviewed the Advanced Practitioner's note and chart, and I agree with the management and plan.  Bettyjane Shenoy H. 11:30 AM   

## 2013-04-27 ENCOUNTER — Ambulatory Visit (INDEPENDENT_AMBULATORY_CARE_PROVIDER_SITE_OTHER): Payer: Self-pay | Admitting: *Deleted

## 2013-04-27 VITALS — BP 107/67 | HR 97 | Temp 97.1°F

## 2013-04-27 DIAGNOSIS — O09212 Supervision of pregnancy with history of pre-term labor, second trimester: Secondary | ICD-10-CM

## 2013-04-27 DIAGNOSIS — O09219 Supervision of pregnancy with history of pre-term labor, unspecified trimester: Secondary | ICD-10-CM

## 2013-05-01 ENCOUNTER — Encounter (HOSPITAL_COMMUNITY): Payer: Self-pay | Admitting: *Deleted

## 2013-05-01 ENCOUNTER — Telehealth (HOSPITAL_COMMUNITY): Payer: Self-pay | Admitting: *Deleted

## 2013-05-01 ENCOUNTER — Inpatient Hospital Stay (HOSPITAL_COMMUNITY)
Admission: AD | Admit: 2013-05-01 | Discharge: 2013-05-01 | Disposition: A | Discharge status: Home / Self Care | Payer: Self-pay | Source: Ambulatory Visit | Attending: Obstetrics & Gynecology | Admitting: Obstetrics & Gynecology

## 2013-05-01 DIAGNOSIS — O99891 Other specified diseases and conditions complicating pregnancy: Secondary | ICD-10-CM | POA: Insufficient documentation

## 2013-05-01 DIAGNOSIS — J101 Influenza due to other identified influenza virus with other respiratory manifestations: Secondary | ICD-10-CM

## 2013-05-01 DIAGNOSIS — R059 Cough, unspecified: Secondary | ICD-10-CM | POA: Insufficient documentation

## 2013-05-01 DIAGNOSIS — R000 Tachycardia, unspecified: Secondary | ICD-10-CM | POA: Insufficient documentation

## 2013-05-01 DIAGNOSIS — R509 Fever, unspecified: Secondary | ICD-10-CM | POA: Insufficient documentation

## 2013-05-01 DIAGNOSIS — R05 Cough: Secondary | ICD-10-CM | POA: Insufficient documentation

## 2013-05-01 DIAGNOSIS — J111 Influenza due to unidentified influenza virus with other respiratory manifestations: Secondary | ICD-10-CM

## 2013-05-01 LAB — INFLUENZA PANEL BY PCR (TYPE A & B): Influenza A By PCR: POSITIVE — AB

## 2013-05-01 MED ORDER — OSELTAMIVIR PHOSPHATE 75 MG PO CAPS: 75.0000 mg | ORAL_CAPSULE | Freq: Two times a day (BID) | ORAL | Status: AC

## 2013-05-01 MED ORDER — LACTATED RINGERS IV BOLUS (SEPSIS)
1000.0000 mL | Freq: Once | INTRAVENOUS | Status: AC
  Administered 2013-05-01: 1000 mL via INTRAVENOUS

## 2013-05-01 NOTE — OB Triage Provider Note (Signed)
History    CSN: [id number]  Arrival date and time: 05/01/13 1311  None  Chief Complaint   Patient presents with   .  Fever   .  Cough    HPI  [name] is a 37 y.o. [id number] at [redacted]w[redacted]d presents for evaluation of fever, cough-nonproductive that started on 24Dec2014. Pt reports that she has a 37 year old who also has similar symptoms. Pt states that she has been having normal fetal movement, no LOF, no VB, and no ctx.  +Fever, no headache, no SOB/CP, no n/v, no d/c, no urinary symptoms, no swelling.  OB History    Grav  Para  Term  Preterm  Abortions  TAB  SAB  Ect  Mult  Living    3  2  1  1   0  0  0  0  0  2      Past Medical History   Diagnosis  Date   .  Migraines    .  Migraine    .  Abnormal Pap smear     Past Surgical History   Procedure  Laterality  Date   .  Leep   2005    Family History   Problem  Relation  Age of Onset   .  Hypertension  Mother     History   Substance Use Topics   .  Smoking status:  Never Smoker   .  Smokeless tobacco:  Never Used   .  Alcohol Use:  No    Allergies: No Known Allergies  Facility-administered medications prior to admission   Medication  Dose  Route  Frequency  Provider  Last Rate  Last Dose   .  hydroxyprogesterone caproate (DELALUTIN) 250 mg/mL injection 250 mg  250 mg  Intramuscular  Weekly  [name], CNM   250 mg at 04/27/13 1133    Prescriptions prior to admission   Medication  Sig  Dispense  Refill   .  acetaminophen (TYLENOL) 500 MG tablet  Take 1,000 mg by mouth every 6 (six) hours as needed.     .  Prenatal Vit-Fe Fumarate-FA (PRENATAL MULTIVITAMIN) TABS tablet  Take 1 tablet by mouth daily at 12 noon.      ROS as above  Physical Exam   Blood pressure 108/64, pulse 125, temperature 98.4 F (36.9 C), temperature source Oral, resp. rate 16, height 5\' 5"  (1.651 m), weight 58.514 kg (129 lb), last menstrual period 11/07/2012.  Physical Exam  VSS - mild tachycardia, afebrile, normotensive. NAD  No LAD, no  tonsilar exudate, nonproductive cough in room.  Tachcyardic, no RR, no mgt  CTAB no wrc  Gravid, NTTP, ND  No c/c/e  FHT: 150s mod var, mult accels >10x15  Toco: no ctx  MAU Course   Procedures  MDM  Flu swab  Assessment and Plan   [name] is a 37 y.o. [id number] at [redacted]w[redacted]d with febrile illness with cough most consistent with viral URI from younger child. Encourage pt to maintain good hydration, take tylenol PRN for fever or pain, expect resolution over next week. If persistent, changing or wrosneing sx recommend pt to come to clinic or ED. PTL precautions reviewed as well. Pt to follow up as previously scheduled on 12/29 for 17P.  [name]  05/01/2013, 2:59 PM   (above note copied from Dr Ike Bene) Flu panel: positive for Influenza A D/C home with comfort care instructions Rx Tamiflu 75mg  BID x 5d F/U  at next scheduled [facility] visit  [name] 05/01/2013 5:08 PM

## 2013-05-01 NOTE — OB Triage Provider Note (Signed)
Attestation of Attending Supervision of Advanced Practitioner (CNM/NP): Evaluation and management procedures were performed by the Advanced Practitioner under my supervision and collaboration.  I have reviewed the Advanced Practitioner's note and chart, and I agree with the management and plan.  HARRAWAY-SMITH, Eydie Wormley 5:35 PM

## 2013-05-01 NOTE — MAU Provider Note (Signed)
  History     CSN: [id number]  Arrival date and time: 05/01/13 1311   None     Chief Complaint  Patient presents with  . Fever  . Cough   HPI [name] is a 37 y.o. [id number] at [redacted]w[redacted]d presents for evaluation of fever, cough-nonproductive that started on 24Dec2014. Pt reports that she has a 37 year old who also has similar symptoms. Pt states that she has been having normal fetal movement, no LOF, no VB, and no ctx.  +Fever, no headache, no SOB/CP, no n/v, no d/c, no urinary symptoms, no swelling. No muscular soreness  OB History   Grav Para Term Preterm Abortions TAB SAB Ect Mult Living   3 2 1 1  0 0 0 0 0 2      Past Medical History  Diagnosis Date  . Migraines   . Migraine   . Abnormal Pap smear     Past Surgical History  Procedure Laterality Date  . Leep  2005    Family History  Problem Relation Age of Onset  . Hypertension Mother     History  Substance Use Topics  . Smoking status: Never Smoker   . Smokeless tobacco: Never Used  . Alcohol Use: No    Allergies: No Known Allergies  Facility-administered medications prior to admission  Medication Dose Route Frequency Provider Last Rate Last Dose  . hydroxyprogesterone caproate (DELALUTIN) 250 mg/mL injection 250 mg  250 mg Intramuscular Weekly [name], CNM   250 mg at 04/27/13 1133   Prescriptions prior to admission  Medication Sig Dispense Refill  . acetaminophen (TYLENOL) 500 MG tablet Take 1,000 mg by mouth every 6 (six) hours as needed.      . Prenatal Vit-Fe Fumarate-FA (PRENATAL MULTIVITAMIN) TABS tablet Take 1 tablet by mouth daily at 12 noon.        ROS as above Physical Exam   Blood pressure 108/64, pulse 125, temperature 98.4 F (36.9 C), temperature source Oral, resp. rate 16, height 5\' 5"  (1.651 m), weight 58.514 kg (129 lb), last menstrual period 11/07/2012.  Physical Exam VSS - mild tachycardia, afebrile, normotensive. NAD No LAD, no tonsilar exudate, nonproductive  cough in room.  Tachcyardic, no RR, no mgt CTAB no wrc Gravid, NTTP, ND  No c/c/e  FHT: 150s mod var, mult accels >10x15 Toco: no ctx  MAU Course  Procedures  MDM Flu swab  1L fluid for tachycardia Assessment and Plan  Note completed by Philipp Deputy - review for complete note

## 2013-05-01 NOTE — MAU Note (Signed)
Patient presents with complaints of fever and cough since the morning of the 24th.

## 2013-05-04 ENCOUNTER — Ambulatory Visit (INDEPENDENT_AMBULATORY_CARE_PROVIDER_SITE_OTHER): Payer: Self-pay | Admitting: *Deleted

## 2013-05-04 VITALS — BP 111/73 | HR 110 | Temp 97.1°F | Wt 127.0 lb

## 2013-05-04 DIAGNOSIS — Z7189 Other specified counseling: Secondary | ICD-10-CM

## 2013-05-04 DIAGNOSIS — O09219 Supervision of pregnancy with history of pre-term labor, unspecified trimester: Secondary | ICD-10-CM

## 2013-05-07 NOTE — L&D Delivery Note (Signed)
Delivery Note At 11:32 PM a viable female was delivered via Vaginal, Spontaneous Delivery (Presentation: Right Occiput Anterior).  APGAR: 8, 9; weight .  Pending skin to skin Placenta status: , .  Cord: 3 vessels with the following complications: None.  Cord pH: not done  Anesthesia: None  Episiotomy: None Lacerations: None Suture Repair: na Est. Blood Loss (mL): 250  Mom to postpartum.  Baby to Nursery Breast feeding.  EURE,LUTHER H 07/22/2013, 11:50 PM

## 2013-05-11 ENCOUNTER — Ambulatory Visit (INDEPENDENT_AMBULATORY_CARE_PROVIDER_SITE_OTHER): Payer: Self-pay | Admitting: *Deleted

## 2013-05-11 VITALS — BP 102/70 | HR 99 | Temp 97.6°F | Wt 125.9 lb

## 2013-05-11 DIAGNOSIS — O09219 Supervision of pregnancy with history of pre-term labor, unspecified trimester: Secondary | ICD-10-CM

## 2013-05-11 DIAGNOSIS — O09212 Supervision of pregnancy with history of pre-term labor, second trimester: Secondary | ICD-10-CM

## 2013-05-13 ENCOUNTER — Other Ambulatory Visit: Payer: Self-pay | Admitting: Obstetrics & Gynecology

## 2013-05-13 DIAGNOSIS — O09529 Supervision of elderly multigravida, unspecified trimester: Secondary | ICD-10-CM

## 2013-05-15 ENCOUNTER — Ambulatory Visit (HOSPITAL_COMMUNITY)
Admission: RE | Admit: 2013-05-15 | Discharge: 2013-05-15 | Disposition: A | Discharge status: Home / Self Care | Payer: Medicaid Other | Source: Ambulatory Visit | Attending: Family Medicine | Admitting: Family Medicine

## 2013-05-15 DIAGNOSIS — O3440 Maternal care for other abnormalities of cervix, unspecified trimester: Secondary | ICD-10-CM | POA: Insufficient documentation

## 2013-05-15 DIAGNOSIS — O09529 Supervision of elderly multigravida, unspecified trimester: Secondary | ICD-10-CM | POA: Insufficient documentation

## 2013-05-18 ENCOUNTER — Ambulatory Visit (INDEPENDENT_AMBULATORY_CARE_PROVIDER_SITE_OTHER): Payer: Self-pay | Admitting: Obstetrics & Gynecology

## 2013-05-18 VITALS — BP 104/67 | Temp 97.5°F | Wt 129.7 lb

## 2013-05-18 DIAGNOSIS — Z23 Encounter for immunization: Secondary | ICD-10-CM

## 2013-05-18 DIAGNOSIS — O09529 Supervision of elderly multigravida, unspecified trimester: Secondary | ICD-10-CM

## 2013-05-18 DIAGNOSIS — O0990 Supervision of high risk pregnancy, unspecified, unspecified trimester: Secondary | ICD-10-CM

## 2013-05-18 DIAGNOSIS — O09219 Supervision of pregnancy with history of pre-term labor, unspecified trimester: Secondary | ICD-10-CM

## 2013-05-18 DIAGNOSIS — O09522 Supervision of elderly multigravida, second trimester: Secondary | ICD-10-CM

## 2013-05-18 LAB — POCT URINALYSIS DIP (DEVICE)
BILIRUBIN URINE: NEGATIVE
Glucose, UA: NEGATIVE mg/dL
Ketones, ur: NEGATIVE mg/dL
NITRITE: NEGATIVE
Protein, ur: NEGATIVE mg/dL
Specific Gravity, Urine: 1.015 (ref 1.005–1.030)
Urobilinogen, UA: 0.2 mg/dL (ref 0.0–1.0)
pH: 7 (ref 5.0–8.0)

## 2013-05-18 LAB — CBC
HCT: 33.4 % — ABNORMAL LOW (ref 36.0–46.0)
Hemoglobin: 11.3 g/dL — ABNORMAL LOW (ref 12.0–15.0)
MCH: 31 pg (ref 26.0–34.0)
MCHC: 33.8 g/dL (ref 30.0–36.0)
MCV: 91.5 fL (ref 78.0–100.0)
PLATELETS: 232 10*3/uL (ref 150–400)
RBC: 3.65 MIL/uL — ABNORMAL LOW (ref 3.87–5.11)
RDW: 13.5 % (ref 11.5–15.5)
WBC: 6.8 10*3/uL (ref 4.0–10.5)

## 2013-05-18 MED ORDER — TETANUS-DIPHTH-ACELL PERTUSSIS 5-2.5-18.5 LF-MCG/0.5 IM SUSP: 0.5000 mL | Freq: Once | INTRAMUSCULAR | Status: DC

## 2013-05-18 NOTE — Progress Notes (Signed)
No complaints, US 1/9 nl growth and cervix 3 cm long

## 2013-05-18 NOTE — Patient Instructions (Addendum)
Tercer trimestre del embarazo  (Third Trimester of Pregnancy) El tercer trimestre del embarazo abarca desde la semana 29 hasta la semana 42, desde el 7 mes hasta el 9. En este trimestre el feto se desarrolla muy rpidamente. Hacia el final del noveno mes, el beb que an no ha nacido mide alrededor de 20 pulgadas (45 cm) de largo y pesa entre 6 y 10 libras (2,700 y 4,500 kg).  CAMBIOS CORPORALES  Su organismo atravesar numerosos cambios durante el embarazo. Los cambios varan de una mujer a otra.   Seguir aumentando de peso. Es esperable que aumente entre 25 y 35 libras (11 16 kg) hacia el final del embarazo.  Podrn aparecer las primeras estras en las caderas, abdomen y mamas.  Tendr necesidad de orinar con ms frecuencia porque el feto baja hacia la pelvis y presiona en la vejiga.  Como consecuencia del embarazo, podr sentir acidez estomacal continuamente.  Podr estar constipada ya que ciertas hormonas hacen que los msculos que hacen progresar los desechos a travs de los intestinos trabajen ms lentamente.  Pueden aparecer hemorroides o abultarse e hincharse las venas (venas varicosas).  Podr sentir dolor plvico debido al aumento de peso ya que las hormonas del embarazo relajan las articulaciones entre los huesos de la pelvis. El dolor de espalda puede ser consecuencia de la exigencia de los msculos que soportan la postura.  Sus mamas seguirn desarrollndose y estarn ms sensibles. A veces sale una secrecin amarilla de las mamas, que se llama calostro.  El ombligo puede salir hacia afuera.  Podr sentir que le falta el aire debido a que se expande el tero.  Podr notar que el feto "baja" o que se siente ms bajo en el abdomen.  Podr tener una prdida de secrecin mucosa con sangre. Esto suele ocurrir entre unos pocos das y una semana antes del parto.  El cuello se vuelve delgado y blando (se borra) cerca de la fecha de parto. QU DEBE ESPERAR EN LAS CONSULTAS  PRENATALES  Le harn exmenes prenatales cada 2 semanas hasta la semana 36. A partir de ese momento le harn exmenes semanales. Durante una visita prenatal de rutina:   La pesarn para verificar que usted y el feto se encuentran dentro de los lmites normales.  Le tomarn la presin arterial.  Le medirn el abdomen para verificar el desarrollo del beb.  Escucharn los latidos fetales.  Se evaluarn los resultados de los estudios solicitados en visitas anteriores.  Le controlarn el cuello del tero cuando est prxima la fecha de parto para ver si se ha borrado. Alrededor de la semana 36 el mdico controlar el cuello del tero. Al mismo tiempo realizar un anlisis de las secreciones del tejido vaginal. Este examen es para determinar si hay un tipo de bacteria, estreptococo Grupo B. El mdico le explicar esto con ms detalle.  El mdico podr preguntarle:   Como le gustara que fuera el parto.  Cmo se siente.  Si siente los movimientos del beb.  Si tiene sntomas anormales, como prdida de lquido, sangrado, dolores de cabeza intenso o clicos abdominales.  Si tiene alguna duda. Otros estudios que podrn realizarse durante el tercer trimestre son:   Anlisis de sangre para controlar sus niveles de hierro (anemia).  Controles fetales para determinar su salud, el nivel de actividad y su desarrollo. Si tiene alguna enfermedad o si tuvo problemas durante el embarazo, le harn estudios. FALSO TRABAJO DE PARTO  Es posible que sienta contracciones pequeas e irregulares que finalmente   desaparecen. Se llaman contracciones de Braxton Hicks o falso trabajo de parto. Las contracciones pueden durar horas, das o an semanas antes de que el verdadero trabajo de parto se inicie. Si las contracciones tienen intervalos regulares, se intensifican o se hacen dolorosas, lo mejor es que la revise su mdico.  SIGNOS DE TRABAJO DE PARTO   Espasmos del tipo menstrual.  Contracciones cada 5  minutos o menos.  Contracciones que comienzan en la parte superior del tero y se expanden hacia abajo, a la zona inferior del abdomen y la espalda.  Sensacin de presin que aumenta en la pelvis o dolor en la espalda.  Aparece una secrecin acuosa o sanguinolenta por la vagina. Si tiene alguno de estos signos antes de la semana 37 del embarazo, llame a su mdico inmediatamente. Debe concurrir al hospital para ser controlada inmediatamente.  INSTRUCCIONES PARA EL CUIDADO EN EL HOGAR   Evite fumar, consumir hierbas, beber alcohol y utilizar frmacos que no le hayan recetado. Estas sustancias qumicas afectan la formacin y el desarrollo del beb.  Siga las indicaciones del profesional con respecto a como tomar los medicamentos. Durante el embarazo, hay medicamentos que son seguros y otros no lo son.  Realice actividad fsica slo segn las indicaciones del mdico. Sentir clicos uterinos es el mejor signo para detener la actividad fsica.  Contine haciendo comidas regulares y sanas.  Use un sostn que le brinde buen soporte si sus mamas estn sensibles.  No utilice la baera con agua caliente, baos turcos o saunas.  Colquese el cinturn de seguridad cuando conduzca.  Evite comer carne cruda queso sin cocinar y el contacto con los utensilios y desperdicios de los gatos. Estos elementos contienen grmenes que pueden causar defectos de nacimiento en el beb.  Tome las vitaminas indicadas para la etapa prenatal.  Pruebe un laxante (si el mdico la autoriza) si tiene constipacin. Consuma ms alimentos ricos en fibra, como vegetales y frutas frescos y cereales enteros. Beba gran cantidad de lquido para mantener la orina de tono claro o amarillo plido.  Tome baos de agua tibia para calmar el dolor o las molestias causadas por las hemorroides. Use una crema para las hemorroides si el mdico la autoriza.  Si tiene venas varicosas, use medias de soporte. Eleve los pies durante 15 minutos,  3 4 veces por da. Limite el consumo de sal en su dieta.  Evite levantar objetos pesados, use zapatos de tacones bajos y mantenga una buena postura.  Descanse con las piernas elevadas si tiene calambres o dolor de cintura.  Visite a su dentista si no lo ha hecho durante el embarazo. Use un cepillo de dientes blando para higienizarse los dientes y use suavemente el hilo dental.  Puede continuar su vida sexual excepto que el mdico le indique otra cosa.  No haga viajes largos excepto que sea absolutamente necesario y slo con la aprobacin de su mdico.  Tome clases prenatales para entender, practicar y hacer preguntas sobre el trabajo de parto y el alumbramiento.  Haga un ensayo sobre la partida al hospital.  Prepare el bolso que llevar al hospital.  Prepare la habitacin del beb.  Contine concurriendo a todas las visitas prenatales segn las indicaciones de su mdico. SOLICITE ATENCIN MDICA SI:   No est segura si est en trabajo de parto o ha roto la bolsa de aguas.  Tiene mareos.  Siente clicos leves, presin en la pelvis o dolor persistente en el abdomen.  Tiene nuseas o vmitos persistentes.  Observa una   secrecin vaginal con mal olor.  Siente dolor al [employer]. SOLICITE ATENCIN MDICA DE INMEDIATO SI:   Tiene fiebre.  Pierde lquido o sangre por la vagina.  Tiene sangrado o pequeas prdidas vaginales.  Siente dolor intenso o clicos en el abdomen.  Sube o baja de peso rpidamente.  Le falta el aire y le duele el pecho al respirar.  Sbitamente se le hincha el rostro, las manos, los tobillos, los pies o las piernas de [place].  No ha sentido los movimientos del beb durante Georgianne Fick.  Siente un dolor de cabeza intenso que no se alivia con medicamentos.  Su visin se modifica. Document Released: 01/31/2005 Document Revised: 12/24/2012 [facility] Patient Information 2014 [place], [place]. Informacin sobre Government social research officer  (Preterm Labor  Information) El parto prematuro comienza antes de la semana 37 de [place]. La duracin de un embarazo normal es de 39 a 41 semanas.  CAUSAS  Generalmente no hay una causa que pueda identificarse del motivo por el que una mujer comienza un trabajo de parto prematuro. Sin embargo, una de las causas conocidas ms frecuentes son las infecciones. Las infecciones del tero, el cuello, la vagina, el lquido [place], la vejiga, los riones y Teacher, adult education de los pulmones (neumona) pueden hacer que el trabajo de parto se inicie. Otras causas que pueden sospecharse son:   Infecciones urogenitales, como infecciones por hongos y vaginosis bacteriana.   Anormalidades uterinas (forma del tero, sptum uterino, fibromas, hemorragias en la placenta).   Un cuello que ha sido operado (puede ser que no permanezca cerrado).   Malformaciones del feto.   Gestaciones mltiples (mellizos, trillizos y ms).   Ruptura del saco amnitico.  FACTORES DE RIESGO   Historia previa de parto prematuro.   Tener ruptura prematura de las membranas (RPM).   La placenta cubre la abertura del cuello (placenta previa).   La placenta se separa del tero (abrupcin placentaria).   El cuello es demasiado dbil para contener al beb en el tero (cuello incompetente).   Hay mucho lquido en el saco amnitico (polihidramnios).   Consumo de drogas o hbito de fumar durante Firefighter.   No aumentar de peso lo suficiente durante el [employer].   Mujeres menores de 18 aos o mayores de [address] [place].   Nivel socioeconmico bajo.   Pertenecer a Engineer, production. SNTOMAS  Los signos y sntomas del trabajo de parto prematuro son:   Public librarian similares a los Designer, jewellery, dolor abdominal o dolor de espalda.  Contracciones uterinas regulares, tan frecuentes como seis por hora, sin importar su intensidad (pueden ser suaves o dolorosas).  Contracciones que comienzan en la parte superior del tero y se expanden hacia  abajo, a la zona inferior del abdomen y la espalda.   Sensacin de aumento de presin en la pelvis.   Aparece una secrecin acuosa o sanguinolenta por la vagina.  TRATAMIENTO  Segn el tiempo del embarazo y otras [place], el mdico puede indicar reposo en cama. Si es necesario, le indicarn medicamentos para TEFL teacher las contracciones y para Customer service manager los pulmones del feto. Si el trabajo de parto se inicia antes de las 34 semanas de [place], se recomienda la hospitalizacin. El tratamiento depende de las condiciones en que se encuentren usted y el feto.  QU DEBE HACER SI PIENSA QUE EST EN TRABAJO DE PARTO PREMATURO?  Comunquese con su mdico inmediatamente. Debe concurrir al hospital para ser controlada inmediatamente.  CMO PUEDE EVITAR EL TRABAJO DE PARTO PREMATURO EN FUTUROS EMBARAZOS?  Usted debe:  Si fuma, abandonar el hbito.  Mantener un peso saludable y evitar sustancias qumicas y drogas innecesarias.  Controlar todo tipo de infeccin.  Informe a su mdico si tiene una historia conocida de trabajo de parto prematuro. Document Released: 07/31/2007 Document Revised: 12/24/2012 [facility] Patient Information 2014 [place], [place].

## 2013-05-18 NOTE — Addendum Note (Signed)
Addended by: Faythe CasaBELLAMY, Shimon Trowbridge M on: 05/18/2013 11:54 AM   Modules accepted: Orders

## 2013-05-18 NOTE — Progress Notes (Signed)
P=99  Pt having 1 hour gtt and 28 week labs.

## 2013-05-19 LAB — RPR

## 2013-05-19 LAB — HIV ANTIBODY (ROUTINE TESTING W REFLEX): HIV: NONREACTIVE

## 2013-05-19 LAB — GLUCOSE TOLERANCE, 1 HOUR (50G) W/O FASTING: Glucose, 1 Hour GTT: 112 mg/dL (ref 70–140)

## 2013-05-25 ENCOUNTER — Ambulatory Visit (INDEPENDENT_AMBULATORY_CARE_PROVIDER_SITE_OTHER): Payer: Self-pay | Admitting: *Deleted

## 2013-05-25 ENCOUNTER — Encounter: Payer: Self-pay | Admitting: *Deleted

## 2013-05-25 VITALS — BP 114/68 | HR 100 | Temp 98.7°F | Wt 129.3 lb

## 2013-05-25 DIAGNOSIS — O09219 Supervision of pregnancy with history of pre-term labor, unspecified trimester: Secondary | ICD-10-CM

## 2013-06-01 ENCOUNTER — Ambulatory Visit (INDEPENDENT_AMBULATORY_CARE_PROVIDER_SITE_OTHER): Payer: Self-pay | Admitting: Obstetrics and Gynecology

## 2013-06-01 ENCOUNTER — Encounter: Payer: Self-pay | Admitting: Obstetrics and Gynecology

## 2013-06-01 VITALS — BP 117/71 | Wt 131.8 lb

## 2013-06-01 DIAGNOSIS — O09219 Supervision of pregnancy with history of pre-term labor, unspecified trimester: Secondary | ICD-10-CM

## 2013-06-01 LAB — POCT URINALYSIS DIP (DEVICE)
Bilirubin Urine: NEGATIVE
GLUCOSE, UA: NEGATIVE mg/dL
Hgb urine dipstick: NEGATIVE
Ketones, ur: NEGATIVE mg/dL
NITRITE: NEGATIVE
PH: 6 (ref 5.0–8.0)
Protein, ur: NEGATIVE mg/dL
Specific Gravity, Urine: 1.01 (ref 1.005–1.030)
Urobilinogen, UA: 0.2 mg/dL (ref 0.0–1.0)

## 2013-06-01 NOTE — Progress Notes (Signed)
Pulse:109 

## 2013-06-01 NOTE — Patient Instructions (Signed)
Third Trimester of Pregnancy  The third trimester is from week 29 through week 42, months 7 through 9. The third trimester is a time when the fetus is growing rapidly. At the end of the ninth month, the fetus is about 20 inches in length and weighs 6 10 pounds.   BODY CHANGES  Your body goes through many changes during pregnancy. The changes vary from woman to woman.    Your weight will continue to increase. You can expect to gain 25 35 pounds (11 16 kg) by the end of the pregnancy.   You may begin to get stretch marks on your hips, abdomen, and breasts.   You may urinate more often because the fetus is moving lower into your pelvis and pressing on your bladder.   You may develop or continue to have heartburn as a result of your pregnancy.   You may develop constipation because certain hormones are causing the muscles that push waste through your intestines to slow down.   You may develop hemorrhoids or swollen, bulging veins (varicose veins).   You may have pelvic pain because of the weight gain and pregnancy hormones relaxing your joints between the bones in your pelvis. Back aches may result from over exertion of the muscles supporting your posture.   Your breasts will continue to grow and be tender. A yellow discharge may leak from your breasts called colostrum.   Your belly button may stick out.   You may feel short of breath because of your expanding uterus.   You may notice the fetus "dropping," or moving lower in your abdomen.   You may have a bloody mucus discharge. This usually occurs a few days to a week before labor begins.   Your cervix becomes thin and soft (effaced) near your due date.  WHAT TO EXPECT AT YOUR PRENATAL EXAMS   You will have prenatal exams every 2 weeks until week 36. Then, you will have weekly prenatal exams. During a routine prenatal visit:   You will be weighed to make sure you and the fetus are growing normally.   Your blood pressure is taken.   Your abdomen will be  measured to track your baby's growth.   The fetal heartbeat will be listened to.   Any test results from the previous visit will be discussed.   You may have a cervical check near your due date to see if you have effaced.  At around 36 weeks, your caregiver will check your cervix. At the same time, your caregiver will also perform a test on the secretions of the vaginal tissue. This test is to determine if a type of bacteria, Group B streptococcus, is present. Your caregiver will explain this further.  Your caregiver may ask you:   What your birth plan is.   How you are feeling.   If you are feeling the baby move.   If you have had any abnormal symptoms, such as leaking fluid, bleeding, severe headaches, or abdominal cramping.   If you have any questions.  Other tests or screenings that may be performed during your third trimester include:   Blood tests that check for low iron levels (anemia).   Fetal testing to check the health, activity level, and growth of the fetus. Testing is done if you have certain medical conditions or if there are problems during the pregnancy.  FALSE LABOR  You may feel small, irregular contractions that eventually go away. These are called Braxton Hicks contractions, or   false labor. Contractions may last for hours, days, or even weeks before true labor sets in. If contractions come at regular intervals, intensify, or become painful, it is best to be seen by your caregiver.   SIGNS OF LABOR    Menstrual-like cramps.   Contractions that are 5 minutes apart or less.   Contractions that start on the top of the uterus and spread down to the lower abdomen and back.   A sense of increased pelvic pressure or back pain.   A watery or bloody mucus discharge that comes from the vagina.  If you have any of these signs before the 37th week of pregnancy, call your caregiver right away. You need to go to the hospital to get checked immediately.  HOME CARE INSTRUCTIONS    Avoid all  smoking, herbs, alcohol, and unprescribed drugs. These chemicals affect the formation and growth of the baby.   Follow your caregiver's instructions regarding medicine use. There are medicines that are either safe or unsafe to take during pregnancy.   Exercise only as directed by your caregiver. Experiencing uterine cramps is a good sign to stop exercising.   Continue to eat regular, healthy meals.   Wear a good support bra for breast tenderness.   Do not use hot tubs, steam rooms, or saunas.   Wear your seat belt at all times when driving.   Avoid raw meat, uncooked cheese, cat litter boxes, and soil used by cats. These carry germs that can cause birth defects in the baby.   Take your prenatal vitamins.   Try taking a stool softener (if your caregiver approves) if you develop constipation. Eat more high-fiber foods, such as fresh vegetables or fruit and whole grains. Drink plenty of fluids to keep your urine clear or pale yellow.   Take warm sitz baths to soothe any pain or discomfort caused by hemorrhoids. Use hemorrhoid cream if your caregiver approves.   If you develop varicose veins, wear support hose. Elevate your feet for 15 minutes, 3 4 times a day. Limit salt in your diet.   Avoid heavy lifting, wear low heal shoes, and practice good posture.   Rest a lot with your legs elevated if you have leg cramps or low back pain.   Visit your dentist if you have not gone during your pregnancy. Use a soft toothbrush to brush your teeth and be gentle when you floss.   A sexual relationship may be continued unless your caregiver directs you otherwise.   Do not travel far distances unless it is absolutely necessary and only with the approval of your caregiver.   Take prenatal classes to understand, practice, and ask questions about the labor and delivery.   Make a trial run to the hospital.   Pack your hospital bag.   Prepare the baby's nursery.   Continue to go to all your prenatal visits as directed  by your caregiver.  SEEK MEDICAL CARE IF:   You are unsure if you are in labor or if your water has broken.   You have dizziness.   You have mild pelvic cramps, pelvic pressure, or nagging pain in your abdominal area.   You have persistent nausea, vomiting, or diarrhea.   You have a bad smelling vaginal discharge.   You have pain with urination.  SEEK IMMEDIATE MEDICAL CARE IF:    You have a fever.   You are leaking fluid from your vagina.   You have spotting or bleeding from your vagina.     You have severe abdominal cramping or pain.   You have rapid weight loss or gain.   You have shortness of breath with chest pain.   You notice sudden or extreme swelling of your face, hands, ankles, feet, or legs.   You have not felt your baby move in over an hour.   You have severe headaches that do not go away with medicine.   You have vision changes.  Document Released: 04/17/2001 Document Revised: 12/24/2012 Document Reviewed: 06/24/2012  ExitCare Patient Information 2014 [employer].

## 2013-06-01 NOTE — Progress Notes (Signed)
On 17-p. Denies PTL sx. Reviewed 28 wk labs> all nl. Still LE in urine with neg culture last month and no UTI sx. Reviewed plans.

## 2013-06-08 ENCOUNTER — Ambulatory Visit (INDEPENDENT_AMBULATORY_CARE_PROVIDER_SITE_OTHER): Payer: Self-pay | Admitting: General Practice

## 2013-06-08 VITALS — BP 104/71 | HR 108 | Temp 97.7°F | Ht 65.0 in | Wt 132.5 lb

## 2013-06-08 DIAGNOSIS — O09219 Supervision of pregnancy with history of pre-term labor, unspecified trimester: Secondary | ICD-10-CM

## 2013-06-15 ENCOUNTER — Ambulatory Visit (INDEPENDENT_AMBULATORY_CARE_PROVIDER_SITE_OTHER): Payer: Self-pay | Admitting: Family Medicine

## 2013-06-15 ENCOUNTER — Encounter: Payer: Self-pay | Admitting: Family Medicine

## 2013-06-15 VITALS — BP 112/73 | Wt 134.1 lb

## 2013-06-15 DIAGNOSIS — O0990 Supervision of high risk pregnancy, unspecified, unspecified trimester: Secondary | ICD-10-CM

## 2013-06-15 DIAGNOSIS — Z9889 Other specified postprocedural states: Secondary | ICD-10-CM

## 2013-06-15 DIAGNOSIS — O09529 Supervision of elderly multigravida, unspecified trimester: Secondary | ICD-10-CM

## 2013-06-15 DIAGNOSIS — IMO0002 Reserved for concepts with insufficient information to code with codable children: Secondary | ICD-10-CM

## 2013-06-15 DIAGNOSIS — O09219 Supervision of pregnancy with history of pre-term labor, unspecified trimester: Secondary | ICD-10-CM

## 2013-06-15 DIAGNOSIS — O3440 Maternal care for other abnormalities of cervix, unspecified trimester: Secondary | ICD-10-CM

## 2013-06-15 LAB — POCT URINALYSIS DIP (DEVICE)
BILIRUBIN URINE: NEGATIVE
Glucose, UA: NEGATIVE mg/dL
Hgb urine dipstick: NEGATIVE
KETONES UR: NEGATIVE mg/dL
NITRITE: NEGATIVE
Protein, ur: NEGATIVE mg/dL
Specific Gravity, Urine: 1.02 (ref 1.005–1.030)
Urobilinogen, UA: 0.2 mg/dL (ref 0.0–1.0)
pH: 6.5 (ref 5.0–8.0)

## 2013-06-15 NOTE — Progress Notes (Signed)
S: 38 yo [id number] @ 1733w3d here for ROBV - some contractions but only 3-4 per day - no vb, lof - +FM.   O : see flowhseet  A/p - 17-ohp today  - PTL precautions discussed  - f/u in 2 weeks

## 2013-06-15 NOTE — Progress Notes (Signed)
P= 100 Occasional edema in ankles/feet.

## 2013-06-22 ENCOUNTER — Ambulatory Visit (INDEPENDENT_AMBULATORY_CARE_PROVIDER_SITE_OTHER): Payer: Self-pay | Admitting: *Deleted

## 2013-06-22 VITALS — BP 109/89 | HR 104 | Temp 98.2°F | Wt 133.6 lb

## 2013-06-22 DIAGNOSIS — O09219 Supervision of pregnancy with history of pre-term labor, unspecified trimester: Secondary | ICD-10-CM

## 2013-06-29 ENCOUNTER — Ambulatory Visit (INDEPENDENT_AMBULATORY_CARE_PROVIDER_SITE_OTHER): Payer: Self-pay | Admitting: Family Medicine

## 2013-06-29 ENCOUNTER — Encounter: Payer: Self-pay | Admitting: Family Medicine

## 2013-06-29 VITALS — BP 112/75 | Temp 97.8°F | Wt 137.0 lb

## 2013-06-29 DIAGNOSIS — Z9889 Other specified postprocedural states: Secondary | ICD-10-CM

## 2013-06-29 DIAGNOSIS — IMO0002 Reserved for concepts with insufficient information to code with codable children: Secondary | ICD-10-CM

## 2013-06-29 DIAGNOSIS — O0990 Supervision of high risk pregnancy, unspecified, unspecified trimester: Secondary | ICD-10-CM

## 2013-06-29 DIAGNOSIS — O09219 Supervision of pregnancy with history of pre-term labor, unspecified trimester: Secondary | ICD-10-CM

## 2013-06-29 DIAGNOSIS — O09529 Supervision of elderly multigravida, unspecified trimester: Secondary | ICD-10-CM

## 2013-06-29 DIAGNOSIS — O3440 Maternal care for other abnormalities of cervix, unspecified trimester: Secondary | ICD-10-CM

## 2013-06-29 LAB — POCT URINALYSIS DIP (DEVICE)
Bilirubin Urine: NEGATIVE
GLUCOSE, UA: NEGATIVE mg/dL
Ketones, ur: NEGATIVE mg/dL
NITRITE: NEGATIVE
Protein, ur: NEGATIVE mg/dL
SPECIFIC GRAVITY, URINE: 1.02 (ref 1.005–1.030)
UROBILINOGEN UA: 0.2 mg/dL (ref 0.0–1.0)
pH: 6.5 (ref 5.0–8.0)

## 2013-06-29 MED ORDER — RANITIDINE HCL 150 MG PO TABS: 150.0000 mg | ORAL_TABLET | Freq: Two times a day (BID) | ORAL | Status: DC

## 2013-06-29 MED ORDER — CYCLOBENZAPRINE HCL 5 MG PO TABS: 5.0000 mg | ORAL_TABLET | Freq: Every evening | ORAL | Status: DC | PRN

## 2013-06-29 NOTE — Progress Notes (Signed)
38 yo [id number] @ 4583w3d here for ROBV - having reflux and not taking anything for it - some contractions but irregular  - no vb, lof. - +FM  O: see flowsheet  A/p - 17- OHP today - US scheduled for 07/09/13 for f/u growth  - zantac for reflux  - PTL precautions discussed  F/u in 2 weeks for visit and one week for 17OHP.

## 2013-06-29 NOTE — Progress Notes (Signed)
P=109  States she had 8 contractions yesterday with some pressure, no contractions or pressure today. Encouraged po intake of water and rest if contractions start.

## 2013-06-29 NOTE — Patient Instructions (Signed)
Tercer trimestre del embarazo  (Third Trimester of Pregnancy) El tercer trimestre del embarazo abarca desde la semana 29 hasta la semana 42, desde el 7 mes hasta el 9. En este trimestre el feto se desarrolla muy rpidamente. Hacia el final del noveno mes, el beb que an no ha nacido mide alrededor de 20 pulgadas (45 cm) de largo y pesa entre 6 y 10 libras (2,700 y 4,500 kg).  CAMBIOS CORPORALES  Su organismo atravesar numerosos cambios durante el embarazo. Los cambios varan de una mujer a otra.   Seguir aumentando de peso. Es esperable que aumente entre 25 y 35 libras (11 16 kg) hacia el final del embarazo.  Podrn aparecer las primeras estras en las caderas, abdomen y mamas.  Tendr necesidad de orinar con ms frecuencia porque el feto baja hacia la pelvis y presiona en la vejiga.  Como consecuencia del embarazo, podr sentir acidez estomacal continuamente.  Podr estar constipada ya que ciertas hormonas hacen que los msculos que hacen progresar los desechos a travs de los intestinos trabajen ms lentamente.  Pueden aparecer hemorroides o abultarse e hincharse las venas (venas varicosas).  Podr sentir dolor plvico debido al aumento de peso ya que las hormonas del embarazo relajan las articulaciones entre los huesos de la pelvis. El dolor de espalda puede ser consecuencia de la exigencia de los msculos que soportan la postura.  Sus mamas seguirn desarrollndose y estarn ms sensibles. A veces sale una secrecin amarilla de las mamas, que se llama calostro.  El ombligo puede salir hacia afuera.  Podr sentir que le falta el aire debido a que se expande el tero.  Podr notar que el feto "baja" o que se siente ms bajo en el abdomen.  Podr tener una prdida de secrecin mucosa con sangre. Esto suele ocurrir entre unos pocos das y una semana antes del parto.  El cuello se vuelve delgado y blando (se borra) cerca de la fecha de parto. QU DEBE ESPERAR EN LAS CONSULTAS  PRENATALES  Le harn exmenes prenatales cada 2 semanas hasta la semana 36. A partir de ese momento le harn exmenes semanales. Durante una visita prenatal de rutina:   La pesarn para verificar que usted y el feto se encuentran dentro de los lmites normales.  Le tomarn la presin arterial.  Le medirn el abdomen para verificar el desarrollo del beb.  Escucharn los latidos fetales.  Se evaluarn los resultados de los estudios solicitados en visitas anteriores.  Le controlarn el cuello del tero cuando est prxima la fecha de parto para ver si se ha borrado. Alrededor de la semana 36 el mdico controlar el cuello del tero. Al mismo tiempo realizar un anlisis de las secreciones del tejido vaginal. Este examen es para determinar si hay un tipo de bacteria, estreptococo Grupo B. El mdico le explicar esto con ms detalle.  El mdico podr preguntarle:   Como le gustara que fuera el parto.  Cmo se siente.  Si siente los movimientos del beb.  Si tiene sntomas anormales, como prdida de lquido, sangrado, dolores de cabeza intenso o clicos abdominales.  Si tiene alguna duda. Otros estudios que podrn realizarse durante el tercer trimestre son:   Anlisis de sangre para controlar sus niveles de hierro (anemia).  Controles fetales para determinar su salud, el nivel de actividad y su desarrollo. Si tiene alguna enfermedad o si tuvo problemas durante el embarazo, le harn estudios. FALSO TRABAJO DE PARTO  Es posible que sienta contracciones pequeas e irregulares que finalmente   desaparecen. Se llaman contracciones de Braxton Hicks o falso trabajo de parto. Las contracciones pueden durar horas, das o an semanas antes de que el verdadero trabajo de parto se inicie. Si las contracciones tienen intervalos regulares, se intensifican o se hacen dolorosas, lo mejor es que la revise su mdico.  SIGNOS DE TRABAJO DE PARTO   Espasmos del tipo menstrual.  Contracciones cada 5  minutos o menos.  Contracciones que comienzan en la parte superior del tero y se expanden hacia abajo, a la zona inferior del abdomen y la espalda.  Sensacin de presin que aumenta en la pelvis o dolor en la espalda.  Aparece una secrecin acuosa o sanguinolenta por la vagina. Si tiene alguno de estos signos antes de la semana 37 del embarazo, llame a su mdico inmediatamente. Debe concurrir al hospital para ser controlada inmediatamente.  INSTRUCCIONES PARA EL CUIDADO EN EL HOGAR   Evite fumar, consumir hierbas, beber alcohol y utilizar frmacos que no le hayan recetado. Estas sustancias qumicas afectan la formacin y el desarrollo del beb.  Siga las indicaciones del profesional con respecto a como tomar los medicamentos. Durante el embarazo, hay medicamentos que son seguros y otros no lo son.  Realice actividad fsica slo segn las indicaciones del mdico. Sentir clicos uterinos es el mejor signo para detener la actividad fsica.  Contine haciendo comidas regulares y sanas.  Use un sostn que le brinde buen soporte si sus mamas estn sensibles.  No utilice la baera con agua caliente, baos turcos o saunas.  Colquese el cinturn de seguridad cuando conduzca.  Evite comer carne cruda queso sin cocinar y el contacto con los utensilios y desperdicios de los gatos. Estos elementos contienen grmenes que pueden causar defectos de nacimiento en el beb.  Tome las vitaminas indicadas para la etapa prenatal.  Pruebe un laxante (si el mdico la autoriza) si tiene constipacin. Consuma ms alimentos ricos en fibra, como vegetales y frutas frescos y cereales enteros. Beba gran cantidad de lquido para mantener la orina de tono claro o amarillo plido.  Tome baos de agua tibia para calmar el dolor o las molestias causadas por las hemorroides. Use una crema para las hemorroides si el mdico la autoriza.  Si tiene venas varicosas, use medias de soporte. Eleve los pies durante 15 minutos,  3 4 veces por da. Limite el consumo de sal en su dieta.  Evite levantar objetos pesados, use zapatos de tacones bajos y mantenga una buena postura.  Descanse con las piernas elevadas si tiene calambres o dolor de cintura.  Visite a su dentista si no lo ha hecho durante el embarazo. Use un cepillo de dientes blando para higienizarse los dientes y use suavemente el hilo dental.  Puede continuar su vida sexual excepto que el mdico le indique otra cosa.  No haga viajes largos excepto que sea absolutamente necesario y slo con la aprobacin de su mdico.  Tome clases prenatales para entender, practicar y hacer preguntas sobre el trabajo de parto y el alumbramiento.  Haga un ensayo sobre la partida al hospital.  Prepare el bolso que llevar al hospital.  Prepare la habitacin del beb.  Contine concurriendo a todas las visitas prenatales segn las indicaciones de su mdico. SOLICITE ATENCIN MDICA SI:   No est segura si est en trabajo de parto o ha roto la bolsa de aguas.  Tiene mareos.  Siente clicos leves, presin en la pelvis o dolor persistente en el abdomen.  Tiene nuseas o vmitos persistentes.  Observa una   secrecin vaginal con mal olor.  Siente dolor al orinar. SOLICITE ATENCIN MDICA DE INMEDIATO SI:   Tiene fiebre.  Pierde lquido o sangre por la vagina.  Tiene sangrado o pequeas prdidas vaginales.  Siente dolor intenso o clicos en el abdomen.  Sube o baja de peso rpidamente.  Le falta el aire y le duele el pecho al respirar.  Sbitamente se le hincha el rostro, las manos, los tobillos, los pies o las piernas de manera extrema.  No ha sentido los movimientos del beb durante una hora.  Siente un dolor de cabeza intenso que no se alivia con medicamentos.  Su visin se modifica. Document Released: 01/31/2005 Document Revised: 12/24/2012 [employer] Patient Information 2014 [employer].  

## 2013-07-06 ENCOUNTER — Encounter: Payer: Self-pay | Admitting: Obstetrics & Gynecology

## 2013-07-06 ENCOUNTER — Ambulatory Visit (INDEPENDENT_AMBULATORY_CARE_PROVIDER_SITE_OTHER): Payer: Self-pay | Admitting: Obstetrics & Gynecology

## 2013-07-06 VITALS — BP 109/74 | Temp 96.9°F | Wt 136.2 lb

## 2013-07-06 DIAGNOSIS — Z8759 Personal history of other complications of pregnancy, childbirth and the puerperium: Secondary | ICD-10-CM

## 2013-07-06 DIAGNOSIS — O09529 Supervision of elderly multigravida, unspecified trimester: Secondary | ICD-10-CM

## 2013-07-06 DIAGNOSIS — O0990 Supervision of high risk pregnancy, unspecified, unspecified trimester: Secondary | ICD-10-CM

## 2013-07-06 DIAGNOSIS — IMO0002 Reserved for concepts with insufficient information to code with codable children: Secondary | ICD-10-CM

## 2013-07-06 DIAGNOSIS — Z8742 Personal history of other diseases of the female genital tract: Secondary | ICD-10-CM

## 2013-07-06 LAB — POCT URINALYSIS DIP (DEVICE)
BILIRUBIN URINE: NEGATIVE
GLUCOSE, UA: NEGATIVE mg/dL
HGB URINE DIPSTICK: NEGATIVE
Ketones, ur: NEGATIVE mg/dL
NITRITE: NEGATIVE
Protein, ur: NEGATIVE mg/dL
Specific Gravity, Urine: 1.02 (ref 1.005–1.030)
UROBILINOGEN UA: 0.2 mg/dL (ref 0.0–1.0)
pH: 6.5 (ref 5.0–8.0)

## 2013-07-06 NOTE — Progress Notes (Signed)
Pulse- 107 Patient reports pelvic pressure/pain and 6-7 contractions a day

## 2013-07-06 NOTE — Progress Notes (Signed)
Pt did not deliver prematurely, may stop 17P, RTC 2 weeks. US for growth in 3 days

## 2013-07-06 NOTE — Patient Instructions (Signed)
Third Trimester of Pregnancy  The third trimester is from week 29 through week 42, months 7 through 9. The third trimester is a time when the fetus is growing rapidly. At the end of the ninth month, the fetus is about 20 inches in length and weighs 6 10 pounds.   BODY CHANGES  Your body goes through many changes during pregnancy. The changes vary from woman to woman.    Your weight will continue to increase. You can expect to gain 25 35 pounds (11 16 kg) by the end of the pregnancy.   You may begin to get stretch marks on your hips, abdomen, and breasts.   You may urinate more often because the fetus is moving lower into your pelvis and pressing on your bladder.   You may develop or continue to have heartburn as a result of your pregnancy.   You may develop constipation because certain hormones are causing the muscles that push waste through your intestines to slow down.   You may develop hemorrhoids or swollen, bulging veins (varicose veins).   You may have pelvic pain because of the weight gain and pregnancy hormones relaxing your joints between the bones in your pelvis. Back aches may result from over exertion of the muscles supporting your posture.   Your breasts will continue to grow and be tender. A yellow discharge may leak from your breasts called colostrum.   Your belly button may stick out.   You may feel short of breath because of your expanding uterus.   You may notice the fetus "dropping," or moving lower in your abdomen.   You may have a bloody mucus discharge. This usually occurs a few days to a week before labor begins.   Your cervix becomes thin and soft (effaced) near your due date.  WHAT TO EXPECT AT YOUR PRENATAL EXAMS   You will have prenatal exams every 2 weeks until week 36. Then, you will have weekly prenatal exams. During a routine prenatal visit:   You will be weighed to make sure you and the fetus are growing normally.   Your blood pressure is taken.   Your abdomen will be  measured to track your baby's growth.   The fetal heartbeat will be listened to.   Any test results from the previous visit will be discussed.   You may have a cervical check near your due date to see if you have effaced.  At around 36 weeks, your caregiver will check your cervix. At the same time, your caregiver will also perform a test on the secretions of the vaginal tissue. This test is to determine if a type of bacteria, Group B streptococcus, is present. Your caregiver will explain this further.  Your caregiver may ask you:   What your birth plan is.   How you are feeling.   If you are feeling the baby move.   If you have had any abnormal symptoms, such as leaking fluid, bleeding, severe headaches, or abdominal cramping.   If you have any questions.  Other tests or screenings that may be performed during your third trimester include:   Blood tests that check for low iron levels (anemia).   Fetal testing to check the health, activity level, and growth of the fetus. Testing is done if you have certain medical conditions or if there are problems during the pregnancy.  FALSE LABOR  You may feel small, irregular contractions that eventually go away. These are called Braxton Hicks contractions, or   false labor. Contractions may last for hours, days, or even weeks before true labor sets in. If contractions come at regular intervals, intensify, or become painful, it is best to be seen by your caregiver.   SIGNS OF LABOR    Menstrual-like cramps.   Contractions that are 5 minutes apart or less.   Contractions that start on the top of the uterus and spread down to the lower abdomen and back.   A sense of increased pelvic pressure or back pain.   A watery or bloody mucus discharge that comes from the vagina.  If you have any of these signs before the 37th week of pregnancy, call your caregiver right away. You need to go to the hospital to get checked immediately.  HOME CARE INSTRUCTIONS    Avoid all  smoking, herbs, alcohol, and unprescribed drugs. These chemicals affect the formation and growth of the baby.   Follow your caregiver's instructions regarding medicine use. There are medicines that are either safe or unsafe to take during pregnancy.   Exercise only as directed by your caregiver. Experiencing uterine cramps is a good sign to stop exercising.   Continue to eat regular, healthy meals.   Wear a good support bra for breast tenderness.   Do not use hot tubs, steam rooms, or saunas.   Wear your seat belt at all times when driving.   Avoid raw meat, uncooked cheese, cat litter boxes, and soil used by cats. These carry germs that can cause birth defects in the baby.   Take your prenatal vitamins.   Try taking a stool softener (if your caregiver approves) if you develop constipation. Eat more high-fiber foods, such as fresh vegetables or fruit and whole grains. Drink plenty of fluids to keep your urine clear or pale yellow.   Take warm sitz baths to soothe any pain or discomfort caused by hemorrhoids. Use hemorrhoid cream if your caregiver approves.   If you develop varicose veins, wear support hose. Elevate your feet for 15 minutes, 3 4 times a day. Limit salt in your diet.   Avoid heavy lifting, wear low heal shoes, and practice good posture.   Rest a lot with your legs elevated if you have leg cramps or low back pain.   Visit your dentist if you have not gone during your pregnancy. Use a soft toothbrush to brush your teeth and be gentle when you floss.   A sexual relationship may be continued unless your caregiver directs you otherwise.   Do not travel far distances unless it is absolutely necessary and only with the approval of your caregiver.   Take prenatal classes to understand, practice, and ask questions about the labor and delivery.   Make a trial run to the hospital.   Pack your hospital bag.   Prepare the baby's nursery.   Continue to go to all your prenatal visits as directed  by your caregiver.  SEEK MEDICAL CARE IF:   You are unsure if you are in labor or if your water has broken.   You have dizziness.   You have mild pelvic cramps, pelvic pressure, or nagging pain in your abdominal area.   You have persistent nausea, vomiting, or diarrhea.   You have a bad smelling vaginal discharge.   You have pain with urination.  SEEK IMMEDIATE MEDICAL CARE IF:    You have a fever.   You are leaking fluid from your vagina.   You have spotting or bleeding from your vagina.     You have severe abdominal cramping or pain.   You have rapid weight loss or gain.   You have shortness of breath with chest pain.   You notice sudden or extreme swelling of your face, hands, ankles, feet, or legs.   You have not felt your baby move in over an hour.   You have severe headaches that do not go away with medicine.   You have vision changes.  Document Released: 04/17/2001 Document Revised: 12/24/2012 Document Reviewed: 06/24/2012  ExitCare Patient Information 2014 [employer].

## 2013-07-07 ENCOUNTER — Other Ambulatory Visit: Payer: Self-pay | Admitting: Family Medicine

## 2013-07-07 DIAGNOSIS — Z141 Cystic fibrosis carrier: Secondary | ICD-10-CM

## 2013-07-07 DIAGNOSIS — O09529 Supervision of elderly multigravida, unspecified trimester: Secondary | ICD-10-CM

## 2013-07-09 ENCOUNTER — Ambulatory Visit (HOSPITAL_COMMUNITY)
Admission: RE | Admit: 2013-07-09 | Discharge: 2013-07-09 | Disposition: A | Discharge status: Home / Self Care | Payer: Medicaid Other | Source: Ambulatory Visit | Attending: Obstetrics & Gynecology | Admitting: Obstetrics & Gynecology

## 2013-07-09 DIAGNOSIS — Z8751 Personal history of pre-term labor: Secondary | ICD-10-CM | POA: Insufficient documentation

## 2013-07-09 DIAGNOSIS — Z141 Cystic fibrosis carrier: Secondary | ICD-10-CM

## 2013-07-09 DIAGNOSIS — O09529 Supervision of elderly multigravida, unspecified trimester: Secondary | ICD-10-CM | POA: Insufficient documentation

## 2013-07-09 DIAGNOSIS — O3440 Maternal care for other abnormalities of cervix, unspecified trimester: Secondary | ICD-10-CM | POA: Insufficient documentation

## 2013-07-10 ENCOUNTER — Ambulatory Visit (HOSPITAL_COMMUNITY): Payer: Self-pay

## 2013-07-20 ENCOUNTER — Ambulatory Visit (INDEPENDENT_AMBULATORY_CARE_PROVIDER_SITE_OTHER): Payer: Self-pay | Admitting: Family Medicine

## 2013-07-20 ENCOUNTER — Encounter: Payer: Self-pay | Admitting: Family Medicine

## 2013-07-20 VITALS — BP 112/78 | Temp 97.1°F | Wt 138.8 lb

## 2013-07-20 DIAGNOSIS — Z8759 Personal history of other complications of pregnancy, childbirth and the puerperium: Secondary | ICD-10-CM

## 2013-07-20 DIAGNOSIS — O3440 Maternal care for other abnormalities of cervix, unspecified trimester: Secondary | ICD-10-CM

## 2013-07-20 DIAGNOSIS — O0990 Supervision of high risk pregnancy, unspecified, unspecified trimester: Secondary | ICD-10-CM

## 2013-07-20 DIAGNOSIS — O09529 Supervision of elderly multigravida, unspecified trimester: Secondary | ICD-10-CM

## 2013-07-20 DIAGNOSIS — O09219 Supervision of pregnancy with history of pre-term labor, unspecified trimester: Secondary | ICD-10-CM

## 2013-07-20 DIAGNOSIS — O09899 Supervision of other high risk pregnancies, unspecified trimester: Secondary | ICD-10-CM

## 2013-07-20 LAB — POCT URINALYSIS DIP (DEVICE)
Bilirubin Urine: NEGATIVE
GLUCOSE, UA: NEGATIVE mg/dL
Ketones, ur: NEGATIVE mg/dL
NITRITE: NEGATIVE
Protein, ur: NEGATIVE mg/dL
Specific Gravity, Urine: 1.02 (ref 1.005–1.030)
UROBILINOGEN UA: 0.2 mg/dL (ref 0.0–1.0)
pH: 6.5 (ref 5.0–8.0)

## 2013-07-20 LAB — OB RESULTS CONSOLE GBS: GBS: NEGATIVE

## 2013-07-20 NOTE — Progress Notes (Signed)
Pulse- 97 Patient reports pelvic pressure/pain and increasing contractions; also reports greenish d/c

## 2013-07-20 NOTE — Patient Instructions (Signed)
Tercer trimestre del embarazo  (Third Trimester of Pregnancy) El tercer trimestre del embarazo abarca desde la semana 29 hasta la semana 42, desde el 7 mes hasta el 9. En este trimestre el feto se desarrolla muy rpidamente. Hacia el final del noveno mes, el beb que an no ha nacido mide alrededor de 20 pulgadas (45 cm) de largo y pesa entre 6 y 10 libras (2,700 y 4,500 kg).  CAMBIOS CORPORALES  Su organismo atravesar numerosos cambios durante el embarazo. Los cambios varan de una mujer a otra.   Seguir aumentando de peso. Es esperable que aumente entre 25 y 35 libras (11 16 kg) hacia el final del embarazo.  Podrn aparecer las primeras estras en las caderas, abdomen y mamas.  Tendr necesidad de orinar con ms frecuencia porque el feto baja hacia la pelvis y presiona en la vejiga.  Como consecuencia del embarazo, podr sentir acidez estomacal continuamente.  Podr estar constipada ya que ciertas hormonas hacen que los msculos que hacen progresar los desechos a travs de los intestinos trabajen ms lentamente.  Pueden aparecer hemorroides o abultarse e hincharse las venas (venas varicosas).  Podr sentir dolor plvico debido al aumento de peso ya que las hormonas del embarazo relajan las articulaciones entre los huesos de la pelvis. El dolor de espalda puede ser consecuencia de la exigencia de los msculos que soportan la postura.  Sus mamas seguirn desarrollndose y estarn ms sensibles. A veces sale una secrecin amarilla de las mamas, que se llama calostro.  El ombligo puede salir hacia afuera.  Podr sentir que le falta el aire debido a que se expande el tero.  Podr notar que el feto "baja" o que se siente ms bajo en el abdomen.  Podr tener una prdida de secrecin mucosa con sangre. Esto suele ocurrir entre unos pocos das y una semana antes del parto.  El cuello se vuelve delgado y blando (se borra) cerca de la fecha de parto. QU DEBE ESPERAR EN LAS CONSULTAS  PRENATALES  Le harn exmenes prenatales cada 2 semanas hasta la semana 36. A partir de ese momento le harn exmenes semanales. Durante una visita prenatal de rutina:   La pesarn para verificar que usted y el feto se encuentran dentro de los lmites normales.  Le tomarn la presin arterial.  Le medirn el abdomen para verificar el desarrollo del beb.  Escucharn los latidos fetales.  Se evaluarn los resultados de los estudios solicitados en visitas anteriores.  Le controlarn el cuello del tero cuando est prxima la fecha de parto para ver si se ha borrado. Alrededor de la semana 36 el mdico controlar el cuello del tero. Al mismo tiempo realizar un anlisis de las secreciones del tejido vaginal. Este examen es para determinar si hay un tipo de bacteria, estreptococo Grupo B. El mdico le explicar esto con ms detalle.  El mdico podr preguntarle:   Como le gustara que fuera el parto.  Cmo se siente.  Si siente los movimientos del beb.  Si tiene sntomas anormales, como prdida de lquido, sangrado, dolores de cabeza intenso o clicos abdominales.  Si tiene alguna duda. Otros estudios que podrn realizarse durante el tercer trimestre son:   Anlisis de sangre para controlar sus niveles de hierro (anemia).  Controles fetales para determinar su salud, el nivel de actividad y su desarrollo. Si tiene alguna enfermedad o si tuvo problemas durante el embarazo, le harn estudios. FALSO TRABAJO DE PARTO  Es posible que sienta contracciones pequeas e irregulares que finalmente   desaparecen. Se llaman contracciones de Braxton Hicks o falso trabajo de parto. Las contracciones pueden durar horas, das o an semanas antes de que el verdadero trabajo de parto se inicie. Si las contracciones tienen intervalos regulares, se intensifican o se hacen dolorosas, lo mejor es que la revise su mdico.  SIGNOS DE TRABAJO DE PARTO   Espasmos del tipo menstrual.  Contracciones cada 5  minutos o menos.  Contracciones que comienzan en la parte superior del tero y se expanden hacia abajo, a la zona inferior del abdomen y la espalda.  Sensacin de presin que aumenta en la pelvis o dolor en la espalda.  Aparece una secrecin acuosa o sanguinolenta por la vagina. Si tiene alguno de estos signos antes de la semana 37 del embarazo, llame a su mdico inmediatamente. Debe concurrir al hospital para ser controlada inmediatamente.  INSTRUCCIONES PARA EL CUIDADO EN EL HOGAR   Evite fumar, consumir hierbas, beber alcohol y utilizar frmacos que no le hayan recetado. Estas sustancias qumicas afectan la formacin y el desarrollo del beb.  Siga las indicaciones del profesional con respecto a como tomar los medicamentos. Durante el embarazo, hay medicamentos que son seguros y otros no lo son.  Realice actividad fsica slo segn las indicaciones del mdico. Sentir clicos uterinos es el mejor signo para detener la actividad fsica.  Contine haciendo comidas regulares y sanas.  Use un sostn que le brinde buen soporte si sus mamas estn sensibles.  No utilice la baera con agua caliente, baos turcos o saunas.  Colquese el cinturn de seguridad cuando conduzca.  Evite comer carne cruda queso sin cocinar y el contacto con los utensilios y desperdicios de los gatos. Estos elementos contienen grmenes que pueden causar defectos de nacimiento en el beb.  Tome las vitaminas indicadas para la etapa prenatal.  Pruebe un laxante (si el mdico la autoriza) si tiene constipacin. Consuma ms alimentos ricos en fibra, como vegetales y frutas frescos y cereales enteros. Beba gran cantidad de lquido para mantener la orina de tono claro o amarillo plido.  Tome baos de agua tibia para calmar el dolor o las molestias causadas por las hemorroides. Use una crema para las hemorroides si el mdico la autoriza.  Si tiene venas varicosas, use medias de soporte. Eleve los pies durante 15 minutos,  3 4 veces por da. Limite el consumo de sal en su dieta.  Evite levantar objetos pesados, use zapatos de tacones bajos y mantenga una buena postura.  Descanse con las piernas elevadas si tiene calambres o dolor de cintura.  Visite a su dentista si no lo ha hecho durante el embarazo. Use un cepillo de dientes blando para higienizarse los dientes y use suavemente el hilo dental.  Puede continuar su vida sexual excepto que el mdico le indique otra cosa.  No haga viajes largos excepto que sea absolutamente necesario y slo con la aprobacin de su mdico.  Tome clases prenatales para entender, practicar y hacer preguntas sobre el trabajo de parto y el alumbramiento.  Haga un ensayo sobre la partida al hospital.  Prepare el bolso que llevar al hospital.  Prepare la habitacin del beb.  Contine concurriendo a todas las visitas prenatales segn las indicaciones de su mdico. SOLICITE ATENCIN MDICA SI:   No est segura si est en trabajo de parto o ha roto la bolsa de aguas.  Tiene mareos.  Siente clicos leves, presin en la pelvis o dolor persistente en el abdomen.  Tiene nuseas o vmitos persistentes.  Observa una   secrecin vaginal con mal olor.  Siente dolor al orinar. SOLICITE ATENCIN MDICA DE INMEDIATO SI:   Tiene fiebre.  Pierde lquido o sangre por la vagina.  Tiene sangrado o pequeas prdidas vaginales.  Siente dolor intenso o clicos en el abdomen.  Sube o baja de peso rpidamente.  Le falta el aire y le duele el pecho al respirar.  Sbitamente se le hincha el rostro, las manos, los tobillos, los pies o las piernas de manera extrema.  No ha sentido los movimientos del beb durante una hora.  Siente un dolor de cabeza intenso que no se alivia con medicamentos.  Su visin se modifica. Document Released: 01/31/2005 Document Revised: 12/24/2012 [employer] Patient Information 2014 [employer].  Lactancia materna (Breastfeeding) Decidir amamantar es  una de las mejores elecciones que puede hacer por usted y su beb. El cambio hormonal durante el embarazo produce el desarrollo del tejido mamario y aumenta la cantidad y el tamao de los conductos galactforos. Estas hormonas tambin permiten que las protenas, los azcares y las grasas de la sangre produzcan la leche materna en las glndulas productoras de leche. Las hormonas impiden que la leche materna sea liberada antes del nacimiento del beb, adems de impulsar el flujo de leche luego del nacimiento. Una vez que ha comenzado a amamantar, pensar en el beb, as como la succin o el llanto, pueden estimular la liberacin de leche de las glndulas productoras de leche.  LOS BENEFICIOS DE AMAMANTAR Para el beb  La primera leche (calostro) ayuda al mejor funcionamiento del sistema digestivo del beb.  La leche tiene anticuerpos que ayudan a prevenir las infecciones en el beb.  El beb tiene una menor incidencia de asma, alergias y del sndrome de muerte sbita del lactante.  Los nutrientes en la leche materna son mejores para el beb que la leche maternizada y estn preparados exclusivamente para cubrir las necesidades del beb.  La leche materna mejora el desarrollo cerebral del beb.  Es menos probable que el beb desarrolle otras enfermedades, como obesidad infantil, asma o diabetes mellitus de tipo 2. Para usted   La lactancia materna favorece el desarrollo de un vnculo muy especial entre la madre y el beb.  Es conveniente. Siempre est disponible a la temperatura correcta y es econmica.  La lactancia materna ayuda a quemar caloras y a perder el peso ganado durante el embarazo.  Favorece la contraccin del tero al tamao que tena antes del embarazo de manera ms rpida y disminuye el sangrado (loquios) despus del parto.  La lactancia materna contribuye a reducir el riesgo de desarrollar diabetes mellitus de tipo 2, osteoporosis o cncer de mama o de ovario en el  futuro. SIGNOS DE QUE EL BEB EST HAMBRIENTO Primeros signos de hambre  Aumenta su estado de alerta o actividad.  Se estira.  Mueve la cabeza de un lado a otro.  Mueve la cabeza y abre la boca cuando se le toca la mejilla o la comisura de la boca (reflejo de bsqueda).  Aumenta las vocalizaciones, tales como sonidos de succin, se relame los labios, emite arrullos, suspiros, o chirridos.  Mueve la mano hacia la boca.  Se chupa con ganas los dedos o las manos. Signos tardos de hambre  Est agitado.  Llora de manera intermitente. Signos de hambre extrema Los signos de hambre extrema requerirn que lo calme y lo consuele antes de que el beb pueda alimentarse adecuadamente. No espere a que se manifiesten los siguientes signos de hambre extrema para comenzar a   amamantar:   Agitacin.  Llanto intenso y fuerte.   Gritos. INFORMACIN BSICA SOBRE LA LACTANCIA MATERNA Iniciacin de la lactancia materna  Encuentre un lugar cmodo para sentarse o acostarse, con un buen respaldo para el cuello y la espalda.  Coloque una almohada o una manta enrollada debajo del beb para acomodarlo a la altura de la mama (si est sentada). Las almohadas para amamantar se han diseado especialmente a fin de servir de apoyo para los brazos y el beb mientras amamanta.  Asegrese de que el abdomen del beb est frente al suyo.  Masajee suavemente la mama. Con las yemas de los dedos, masajee la pared del pecho hacia el pezn en un movimiento circular. Esto estimula el flujo de leche. Es posible que deba continuar este movimiento mientras amamanta si la leche fluye lentamente.  Sostenga la mama con el pulgar por arriba del pezn y los otros 4 dedos por debajo de la mama. Asegrese de que los dedos se encuentren lejos del pezn y de la boca del beb.  Empuje suavemente los labios del beb con el pezn o con el dedo.  Cuando la boca del beb se abra lo suficiente, acrquelo rpidamente a la mama e  introduzca todo el pezn y la zona oscura que lo rodea (areola), tanto como sea posible, dentro de la boca del beb.  Debe haber ms areola visible por arriba del labio superior del beb que por debajo del labio inferior.  La lengua del beb debe estar entre la enca inferior y la mama.  Asegrese de que la boca del beb est en la posicin correcta alrededor del pezn (prendida). Los labios del beb deben crear un sello sobre la mama, doblndose hacia afuera (invertidos).  Es comn que el beb succione durante 2 a 3 minutos para que comience el flujo de leche materna. Cmo debe prenderse Es muy importante que le ensee al beb cmo prenderse adecuadamente a la mama. Si el beb no se prende adecuadamente, puede causarle dolor en el pezn y reducir la produccin de leche materna, y hacer que el beb tenga un escaso aumento de peso. Adems, si el beb no se prende adecuadamente al pezn, puede tragar aire durante la alimentacin. Esto puede causarle molestias al beb. Hacer eructar al beb al cambiar de mama puede ayudarlo a liberar el aire. Sin embargo, ensearle al beb cmo prenderse a la mama adecuadamente es la mejor manera de evitar que se sienta molesto por tragar aire mientras se alimenta. Signos de que el beb se ha prendido adecuadamente al pezn:   Tironea o succiona de modo silencioso, sin causarle dolor.  Se escucha que traga cada 3 o 4 succiones.   Hay movimientos musculares por arriba y por delante de sus odos al succionar. Signos de que el beb no se ha prendido adecuadamente al pezn:   Hace ruidos de succin o de chasquido mientras se alimenta.  Dolor en el pezn. Si cree que el beb no se prendi correctamente, deslice el dedo en la comisura de la boca y colquelo entre las encas del beb para interrumpir la succin. Intente comenzar a amamantar nuevamente. Signos de lactancia materna exitosa Signos del beb:   Disminucin gradual en el nmero de succiones o cese  completo de la succin.  Se duerme.  Relaja el cuerpo.  Retiene una pequea cantidad de leche en su boca.  Se desprende solo del pecho. Signos que presenta usted:  Las mamas han aumentado la firmeza, el peso y el tamao   1 a 3 horas despus de amamantar.  Estn ms blandas inmediatamente despus de amamantar.  Un aumento del volumen de leche, y tambin el cambio de su consistencia y color se producen hacia el quinto da de lactancia materna.  Los pezones no duelen, ni estn agrietados ni sangran. Signos de que su beb recibe la cantidad de leche suficiente  Moja al menos 3 paales en 24 horas. La orina debe ser clara y de color amarillo plido a los 5 das de vida.  Defeca al menos 3 veces en 24 horas a los 5 das de vida. La materia fecal debe ser blanda y amarillenta.  Defeca al menos 3 veces en 24 horas a los 7 das de vida. La materia fecal debe ser grumosa y amarillenta.  No registra una prdida de peso mayor del 10% del peso al nacer durante los primeros 3 das de vida.  Aumenta de peso un promedio de 4 a 7onzas (120 a 210ml) por semana despus de los 4 das de vida.  Aumenta de peso, diariamente, de manera consistente a partir de los 5 das de vida, sin registrar prdida de peso despus de las 2 semanas de vida. Despus de alimentarse, es posible que el beb regurgite una pequea cantidad. Esto es frecuente. FRECUENCIA Y DURACIN DE LA LACTANCIA MATERNA El amamantamiento frecuente la ayudar a producir ms leche y a prevenir problemas de dolor en los pezones e hinchazn en las mamas. Alimente al beb cuando muestre signos de hambre o si siente la necesidad de reducir la congestin de las mamas. Esto se denomina "lactancia a demanda". Evite el uso del chupete mientras trabaja para establecer la lactancia (las primeras 4 a 6 semanas despus del nacimiento del beb). Despus de este perodo, podr ofrecerle un chupete. Las investigaciones demostraron que el uso del chupete  durante el primer ao de vida del beb disminuye el riesgo de desarrollar el sndrome de muerte sbita del lactante (SMSL). Permita que el nio se alimente en cada mama todo lo que desee. Contine amamantando al beb hasta que haya terminado de alimentarse. Cuando el beb se desprende o se queda dormido mientras se est alimentando de la primera mama, ofrzcale la segunda. Debido a que, con frecuencia, los recin nacidos permanecen somnolientos las primeras semanas de vida, es posible que deba despertar a su beb para alimentarlo. Los horarios de lactancia varan de un beb a otro. Sin embargo, las siguientes reglas pueden servir como gua para ayudarle a garantizar que el beb se alimenta adecuadamente:  Se puede amamantar a los recin nacidos (bebs de 4 semanas o menos de vida) cada 1 a 3 horas.  No deben transcurrir ms de 3 horas durante el da o 5 horas durante la noche sin que se amamante a los recin nacidos.  Debe amamantar al beb 8 veces como mnimo, en un perodo de 24 horas, hasta que comience a introducir slidos en su dieta, a los 6 meses de vida aproximadamente. EXTRACCIN DE LECHE MATERNA La extraccin y el almacenamiento de la leche materna le permiten asegurarse de que el beb se alimente exclusivamente de leche materna, aun en momentos en los que no puede amamantar. Esto tiene especial importancia si debe regresar al trabajo en el perodo en que an est amamantando o si no puede estar presente en los momentos en que el beb debe alimentarse. Su asesor en lactancia puede orientarla sobre cunto tiempo es seguro almacenar leche materna.  El sacaleche es un aparato que le permite extraer leche   de la mama a un recipiente estril. Luego, la leche materna extrada puede almacenarse en un refrigerador o freezer. Algunos sacaleches son manuales, mientras que otros son elctricos. Consulte a su asesor en lactancia qu tipo ser ms conveniente para usted. Los sacaleches se pueden comprar, sin  embargo, algunos hospitales y grupos de apoyo a la lactancia materna alquilan sacaleches mensualmente. Un asesor en lactancia puede ensearle cmo extraer leche materna manualmente, en caso de que prefiera no usar un sacaleche.  CMO CUIDAR LAS MAMAS DURANTE LA LACTANCIA MATERNA Los pezones se secan, agrietan y duelen durante la lactancia materna. Las siguientes recomendaciones pueden ayudarle a mantener las mamas humectadas y sanas:  Evite usar jabn en los pezones.  Use un sostn de soporte. Aunque no son esenciales, las camisetas sin mangas o los sostenes especiales para amamantar estn diseados para acceder fcilmente a las mamas, para amamantar sin tener que quitarse todo el sostn o la camiseta. Evite usar sostenes con aro o sostenes muy ajustados.  Seque al aire sus pezones durante 3 a 4minutos despus de amamantar al beb.  Utilice solo apsitos de algodn en el sostn para absorber las prdidas de leche. La prdida de un poco de leche materna entre las tomas es normal.  Utilice lanolina sobre los pezones luego de amamantar. La lanolina ayuda a mantener la humedad normal de la piel. Si usa lanolina pura, no tiene que lavarse los pezones antes de alimentar al beb. La lanolina pura no es txica para el beb. Adems, puede extraer manualmente algunas gotas de leche materna y masajear suavemente esa leche sobre los pezones, para que la leche se seque al aire. Durante las primeras semanas despus de dar a luz, algunas mujeres pueden experimentar hinchazn en las mamas (congestin mamaria). La congestin puede hacer que sienta las mamas pesadas, calientes y sensibles al tacto. El pico de la congestin ocurre dentro de los 3 a 5 das despus del parto. Las siguientes recomendaciones pueden ayudarle a aliviar la congestin:  Vace por completo las mamas al amamantar o extraer leche. Puede aplicar calor hmedo en las mamas (en la ducha o con toallas hmedas para manos) antes de amamantar o extraer  leche. Esto aumenta la circulacin y ayuda a que la leche fluya. Si el beb no vaca por completo las mamas cuando lo amamanta, extraiga la leche restante despus de que haya finalizado.  Use un sostn ajustado (para amamantar o comn) o camiseta sin mangas durante 1 o 2 das para indicar al cuerpo que disminuya ligeramente la produccin de leche.  Aplique compresas de hielo sobre las mamas, a menos que le resulte demasiado incmodo.  Asegrese de que el beb se encuentre en la posicin correcta mientras lo alimenta. Si la congestin persiste luego de 48 horas o despus de seguir estas recomendaciones, comunquese con su mdico o un asesor en lactancia. RECOMENDACIONES GENERALES PARA EL CUIDADO DE LA SALUD DURANTE LA LACTANCIA MATERNA  Consuma alimentos saludables. Alterne comidas y colaciones, comiendo 3 de cada una por da. Dado que lo que come afecta la leche materna, es posible que algunas comidas hagan que su beb se vuelva ms irritable de lo habitual. Evite comer este tipo de alimentos, si percibe que afectan de manera negativa al beb.  Beba leche, jugos de fruta y agua para satisfacer su sed (aproximadamente 10 vasos al da).  Descanse con frecuencia, reljese y tome sus vitaminas prenatales para evitar la fatiga, el estrs y la anemia.  Contine con los autocontroles de la mama.    Evite masticar y fumar tabaco.  Evite el consumo de alcohol y drogas. Algunos medicamentos, que pueden ser perjudiciales para el beb, pueden pasar a travs de la leche materna. Es importante que consulte a su mdico antes de tomar cualquier medicamento, incluidos todos los medicamentos recetados y de venta libre, as como los suplementos vitamnicos y herbales. Puede quedar embarazada durante la lactancia. Si desea controlar la natalidad, consulte a su mdico cules son las opciones ms seguras para el beb. SOLICITE ATENCIN MDICA SI:   Usted siente que quiere dejar de amamantar o se siente frustrada  con la lactancia.  Siente dolor en las mamas o en los pezones.  Sus pezones estn agrietados o sangran.  Sus pechos estn irritados, sensibles o calientes.  Tiene un rea hinchada en cualquiera de las mamas.  Siente escalofros o fiebre.  Tiene nuseas o vmitos.  Presenta una secrecin de otro lquido distinto de la leche materna de los pezones.  Sus mamas no se llenan antes de amamantar al beb para el 5. da despus del parto.  Se siente triste y deprimida.  El beb est demasiado somnoliento como para comer bien.  El beb tiene problemas para dormir.  Moja menos de 3 paales en 24 horas.  Defeca menos de 3 veces en 24 horas.  La piel del beb o la parte blanca de sus ojos est amarilla.  El beb no ha aumentado de peso a los 5 das de vida. SOLICITE ATENCIN MDICA DE INMEDIATO SI:   El beb est muy cansado (aletargado) y no se despierta para comer.  Le sube la fiebre sin causa. Document Released: 04/23/2005 Document Revised: 08/18/2012 [employer] Patient Information 2014 [employer].  

## 2013-07-20 NOTE — Progress Notes (Signed)
U/s from 3/5--vtx, 5 lb 9 oz, AFI 11 Reports contractions 3 days ago, only 1 yesterday. Preterm labor precautions. Cultures today. Check urine culture--complaining of pressure with urination, large leuks in urine.

## 2013-07-21 ENCOUNTER — Encounter (HOSPITAL_COMMUNITY): Payer: Self-pay

## 2013-07-21 ENCOUNTER — Inpatient Hospital Stay (HOSPITAL_COMMUNITY)
Admission: AD | Admit: 2013-07-21 | Discharge: 2013-07-21 | Disposition: A | Discharge status: Home / Self Care | Payer: Medicaid Other | Source: Ambulatory Visit | Attending: Obstetrics & Gynecology | Admitting: Obstetrics & Gynecology

## 2013-07-21 DIAGNOSIS — O4700 False labor before 37 completed weeks of gestation, unspecified trimester: Secondary | ICD-10-CM | POA: Insufficient documentation

## 2013-07-21 LAB — GC/CHLAMYDIA PROBE AMP
CT Probe RNA: NEGATIVE
GC Probe RNA: NEGATIVE

## 2013-07-21 NOTE — Discharge Instructions (Signed)
Keep your scheduled appointment for prenatal care. Drink 8-10 glasses of water per day. Return to MAU as needed. °

## 2013-07-21 NOTE — MAU Note (Signed)
Contractions since midnight, every 5 min now.  No leaking fluid, pink tinge mucus

## 2013-07-22 ENCOUNTER — Inpatient Hospital Stay (HOSPITAL_COMMUNITY)
Admission: AD | Admit: 2013-07-22 | Discharge: 2013-07-24 | DRG: 775 | Disposition: A | Discharge status: Home / Self Care | Payer: Medicaid Other | Source: Ambulatory Visit | Attending: Obstetrics & Gynecology | Admitting: Obstetrics & Gynecology

## 2013-07-22 ENCOUNTER — Encounter (HOSPITAL_COMMUNITY): Payer: Self-pay | Admitting: *Deleted

## 2013-07-22 DIAGNOSIS — Z8759 Personal history of other complications of pregnancy, childbirth and the puerperium: Secondary | ICD-10-CM

## 2013-07-22 DIAGNOSIS — O09529 Supervision of elderly multigravida, unspecified trimester: Secondary | ICD-10-CM

## 2013-07-22 DIAGNOSIS — Z141 Cystic fibrosis carrier: Secondary | ICD-10-CM

## 2013-07-22 DIAGNOSIS — O0990 Supervision of high risk pregnancy, unspecified, unspecified trimester: Secondary | ICD-10-CM

## 2013-07-22 DIAGNOSIS — O09899 Supervision of other high risk pregnancies, unspecified trimester: Secondary | ICD-10-CM

## 2013-07-22 DIAGNOSIS — O3440 Maternal care for other abnormalities of cervix, unspecified trimester: Secondary | ICD-10-CM

## 2013-07-22 DIAGNOSIS — IMO0001 Reserved for inherently not codable concepts without codable children: Secondary | ICD-10-CM

## 2013-07-22 DIAGNOSIS — Z9889 Other specified postprocedural states: Secondary | ICD-10-CM

## 2013-07-22 DIAGNOSIS — IMO0002 Reserved for concepts with insufficient information to code with codable children: Secondary | ICD-10-CM

## 2013-07-22 LAB — CULTURE, BETA STREP (GROUP B ONLY)

## 2013-07-22 LAB — CULTURE, OB URINE
COLONY COUNT: NO GROWTH
Organism ID, Bacteria: NO GROWTH

## 2013-07-22 MED ORDER — LACTATED RINGERS IV SOLN: 500.0000 mL | INTRAVENOUS | Status: DC | PRN

## 2013-07-22 MED ORDER — OXYTOCIN 10 UNIT/ML IJ SOLN
INTRAMUSCULAR | Status: AC
  Administered 2013-07-22: 10 [IU]
  Filled 2013-07-22: qty 1

## 2013-07-22 MED ORDER — IBUPROFEN 600 MG PO TABS
600.0000 mg | ORAL_TABLET | Freq: Four times a day (QID) | ORAL | Status: DC | PRN
  Administered 2013-07-22: 600 mg via ORAL
  Filled 2013-07-22: qty 1

## 2013-07-22 MED ORDER — ACETAMINOPHEN 325 MG PO TABS: 650.0000 mg | ORAL_TABLET | ORAL | Status: DC | PRN

## 2013-07-22 MED ORDER — OXYCODONE-ACETAMINOPHEN 5-325 MG PO TABS
1.0000 | ORAL_TABLET | ORAL | Status: DC | PRN
  Administered 2013-07-22: 1 via ORAL
  Filled 2013-07-22: qty 1

## 2013-07-22 MED ORDER — LACTATED RINGERS IV SOLN: INTRAVENOUS | Status: DC

## 2013-07-22 MED ORDER — LIDOCAINE HCL (PF) 1 % IJ SOLN
30.0000 mL | INTRAMUSCULAR | Status: DC | PRN
  Filled 2013-07-22: qty 30

## 2013-07-22 MED ORDER — CITRIC ACID-SODIUM CITRATE 334-500 MG/5ML PO SOLN: 30.0000 mL | ORAL | Status: DC | PRN

## 2013-07-22 MED ORDER — OXYTOCIN BOLUS FROM INFUSION: 500.0000 mL | INTRAVENOUS | Status: DC

## 2013-07-22 MED ORDER — LIDOCAINE HCL (PF) 1 % IJ SOLN
INTRAMUSCULAR | Status: AC
  Filled 2013-07-22: qty 30

## 2013-07-22 MED ORDER — ONDANSETRON HCL 4 MG/2ML IJ SOLN: 4.0000 mg | Freq: Four times a day (QID) | INTRAMUSCULAR | Status: DC | PRN

## 2013-07-22 MED ORDER — OXYTOCIN 40 UNITS IN LACTATED RINGERS INFUSION - SIMPLE MED: 62.5000 mL/h | INTRAVENOUS | Status: DC

## 2013-07-22 NOTE — MAU Note (Signed)
Pt seen in MAU complaining of "baby is coming" Pt asked not to push yet. VE  done

## 2013-07-22 NOTE — H&P (Signed)
[name] is a 38 y.o. female G3P2002 at 36.5wks by 12wk U/S presenting for eval of ctx which began approx 2 hours prior to arrival. Reports leaking fluid about 5 mins prior to arrival as well. No vag bldg. +FM. [facility] and has been remarkable for 1) AMA 2) hx prev LBW infant at term 3) hx LEEP 4) CF carrier 5) marginal previa- resolved. History OB History   Grav Para Term Preterm Abortions TAB SAB Ect Mult Living   3 2 2  0 0 0 0 0 0 2     Past Medical History  Diagnosis Date  . Migraines   . Migraine   . Abnormal Pap smear    Past Surgical History  Procedure Laterality Date  . Leep  2005  . No past surgeries     Family History: family history includes Hypertension in her mother. Social History:  reports that she has never smoked. She has never used smokeless tobacco. She reports that she does not drink alcohol or use illicit drugs.   Prenatal Transfer Tool  Maternal Diabetes: No Genetic Screening: Normal- NIPS Maternal Ultrasounds/Referrals: Normal Fetal Ultrasounds or other Referrals:  None Maternal Substance Abuse:  No Significant Maternal Medications:  None Significant Maternal Lab Results:  Lab values include: Group B Strep negative Other Comments:  None  ROS  Dilation: 8 Effacement (%): 90 Exam by:: Alisia FerrariBelinda Bethea RN Last menstrual period 11/07/2012. Exam Physical Exam  Constitutional: She is oriented to person, place, and time. She appears well-developed.  HENT:  Head: Normocephalic.  Cardiovascular: Normal rate.   Respiratory: Effort normal.  GI:  FHR 120s +accels, variables with pushing Ctx q 2-3 mins  Genitourinary: Vagina normal.  Musculoskeletal: Normal range of motion.  Neurological: She is alert and oriented to person, place, and time.  Skin: Skin is warm and dry.  Psychiatric: She has a normal mood and affect. Her behavior is normal. Thought content normal.    Prenatal labs: ABO, Rh: O/Positive/-- (09/22  0000) Antibody: Negative (09/22 0000) Rubella: Immune (09/22 0000) RPR: NON REAC (01/12 1148)  HBsAg: Negative (09/22 0000)  HIV: NON REACTIVE (01/12 1148)  GBS: Negative (03/16 0000)   Assessment/Plan: IUP at 36.5wks Active labor/transition GBS neg  [facility]ant management Anticipate SVD   Cam HaiSHAW, Laquitha Heslin 07/22/2013, 11:38 PM

## 2013-07-23 ENCOUNTER — Encounter (HOSPITAL_COMMUNITY): Payer: Self-pay | Admitting: *Deleted

## 2013-07-23 ENCOUNTER — Encounter: Payer: Self-pay | Admitting: Family Medicine

## 2013-07-23 LAB — CBC
HCT: 34.6 % — ABNORMAL LOW (ref 36.0–46.0)
Hemoglobin: 12.2 g/dL (ref 12.0–15.0)
MCH: 31.8 pg (ref 26.0–34.0)
MCHC: 35.3 g/dL (ref 30.0–36.0)
MCV: 90.1 fL (ref 78.0–100.0)
PLATELETS: 193 10*3/uL (ref 150–400)
RBC: 3.84 MIL/uL — ABNORMAL LOW (ref 3.87–5.11)
RDW: 13.5 % (ref 11.5–15.5)
WBC: 16.1 10*3/uL — ABNORMAL HIGH (ref 4.0–10.5)

## 2013-07-23 LAB — ABO/RH: ABO/RH(D): O POS

## 2013-07-23 LAB — TYPE AND SCREEN
ABO/RH(D): O POS
Antibody Screen: NEGATIVE

## 2013-07-23 LAB — RPR: RPR Ser Ql: NONREACTIVE

## 2013-07-23 MED ORDER — ONDANSETRON HCL 4 MG/2ML IJ SOLN: 4.0000 mg | INTRAMUSCULAR | Status: DC | PRN

## 2013-07-23 MED ORDER — ONDANSETRON HCL 4 MG PO TABS: 4.0000 mg | ORAL_TABLET | ORAL | Status: DC | PRN

## 2013-07-23 MED ORDER — IBUPROFEN 600 MG PO TABS
600.0000 mg | ORAL_TABLET | Freq: Four times a day (QID) | ORAL | Status: DC
  Administered 2013-07-23 – 2013-07-24 (×6): 600 mg via ORAL
  Filled 2013-07-23 (×6): qty 1

## 2013-07-23 MED ORDER — SIMETHICONE 80 MG PO CHEW: 80.0000 mg | CHEWABLE_TABLET | ORAL | Status: DC | PRN

## 2013-07-23 MED ORDER — LANOLIN HYDROUS EX OINT: TOPICAL_OINTMENT | CUTANEOUS | Status: DC | PRN

## 2013-07-23 MED ORDER — OXYCODONE-ACETAMINOPHEN 5-325 MG PO TABS
1.0000 | ORAL_TABLET | ORAL | Status: DC | PRN
  Administered 2013-07-23 – 2013-07-24 (×6): 1 via ORAL
  Filled 2013-07-23 (×6): qty 1

## 2013-07-23 MED ORDER — BENZOCAINE-MENTHOL 20-0.5 % EX AERO
1.0000 "application " | INHALATION_SPRAY | CUTANEOUS | Status: DC | PRN
  Administered 2013-07-23 – 2013-07-24 (×2): 1 via TOPICAL
  Filled 2013-07-23 (×2): qty 56

## 2013-07-23 MED ORDER — TETANUS-DIPHTH-ACELL PERTUSSIS 5-2.5-18.5 LF-MCG/0.5 IM SUSP: 0.5000 mL | Freq: Once | INTRAMUSCULAR | Status: DC

## 2013-07-23 MED ORDER — PRENATAL MULTIVITAMIN CH
1.0000 | ORAL_TABLET | Freq: Every day | ORAL | Status: DC
  Administered 2013-07-23 – 2013-07-24 (×2): 1 via ORAL
  Filled 2013-07-23 (×2): qty 1

## 2013-07-23 MED ORDER — WITCH HAZEL-GLYCERIN EX PADS
1.0000 | MEDICATED_PAD | CUTANEOUS | Status: DC | PRN
Start: 2013-07-23 — End: 2013-07-24

## 2013-07-23 MED ORDER — DIBUCAINE 1 % RE OINT: 1.0000 "application " | TOPICAL_OINTMENT | RECTAL | Status: DC | PRN

## 2013-07-23 MED ORDER — PNEUMOCOCCAL VAC POLYVALENT 25 MCG/0.5ML IJ INJ
0.5000 mL | INJECTION | INTRAMUSCULAR | Status: AC
  Administered 2013-07-23: 0.5 mL via INTRAMUSCULAR
  Filled 2013-07-23: qty 0.5

## 2013-07-23 MED ORDER — DIPHENHYDRAMINE HCL 25 MG PO CAPS: 25.0000 mg | ORAL_CAPSULE | Freq: Four times a day (QID) | ORAL | Status: DC | PRN

## 2013-07-23 MED ORDER — SENNOSIDES-DOCUSATE SODIUM 8.6-50 MG PO TABS
2.0000 | ORAL_TABLET | ORAL | Status: DC
  Administered 2013-07-23 – 2013-07-24 (×2): 2 via ORAL
  Filled 2013-07-23 (×2): qty 2

## 2013-07-23 MED ORDER — ZOLPIDEM TARTRATE 5 MG PO TABS: 5.0000 mg | ORAL_TABLET | Freq: Every evening | ORAL | Status: DC | PRN

## 2013-07-23 NOTE — Progress Notes (Signed)
UR completed 

## 2013-07-23 NOTE — Lactation Note (Signed)
This note was copied from the chart of [name]. Lactation Consultation Note Mom attempting to BF when entered the rm. Cradle positioning. Mom speaks limited [place]. Dad at bedside speaks [place]. Health visitorffered Interpreter and declined. Gave mom several options in feeding positions for a deeper depth. Small breast, denies pain, no redness noted. Stated that she put the other children to breast w/o any problems. Demonstrated massage and football hold and other hand using C position to massage and support breast. Gave Spanish Breast feeding Resource Brochure given and discussed. Heard autible swallows from baby. Discussed since the baby was SGA, the importance of feeding every 2-3 hrs, waking tech. And keeping feeding diary. Call for assistance if needed. Patient Name: Teresa Doreene NestMayaciel Maggi EAVWU'JToday's Date: 07/23/2013 Reason for consult: Initial assessment;Infant < 6lbs   Maternal Data Has patient been taught Hand Expression?: Yes Does the patient have breastfeeding experience prior to this delivery?: Yes  Feeding Feeding Type: Breast Fed Length of feed: 20 min (still feeding when left the rm.)  LATCH Score/Interventions Latch: Repeated attempts needed to sustain latch, nipple held in mouth throughout feeding, stimulation needed to elicit sucking reflex. Intervention(s): Assist with latch;Breast massage;Breast compression;Adjust position  Audible Swallowing: A few with stimulation Intervention(s): Skin to skin;Hand expression Intervention(s): Skin to skin;Hand expression;Alternate breast massage  Type of Nipple: Everted at rest and after stimulation  Comfort (Breast/Nipple): Soft / non-tender     Hold (Positioning): Assistance needed to correctly position infant at breast and maintain latch. Intervention(s): Breastfeeding basics reviewed;Support Pillows;Position options;Skin to skin  LATCH Score: 7  Lactation Tools Discussed/Used     Consult Status Consult Status:  Follow-up Date: 07/24/13 Follow-up type: In-patient    Charyl DancerCARVER, [name] 07/23/2013, 5:38 PM

## 2013-07-23 NOTE — Progress Notes (Signed)
I have seen and examined this patient and I agree with the above. Pt is breastfeeding. LafayetteSHAW, [name] 9:04 AM 07/23/2013

## 2013-07-23 NOTE — Progress Notes (Signed)
Post Partum Day 1  Subjective: no complaints, up ad lib, voiding, tolerating PO and + flatus  Objective: Blood pressure 113/72, pulse 81, temperature 97.8 F (36.6 C), temperature source Oral, resp. rate 18, height 5\' 5"  (1.651 m), weight 63.957 kg (141 lb), last menstrual period 11/07/2012, unknown if currently breastfeeding.  Physical Exam:  General: alert and cooperative Lochia: appropriate Uterine Fundus: firm DVT Evaluation: No evidence of DVT seen on physical exam.   Recent Labs  07/23/13 0100  HGB 12.2  HCT 34.6*    Assessment/Plan: Plan for discharge tomorrow and deciding on contraception. Pt has migraines, so will need a contraceptive that works with this.   LOS: 1 day   Unknown FoleyWinkel, Bell Cai T 07/23/2013, 6:54 AM

## 2013-07-23 NOTE — Lactation Note (Addendum)
This note was copied from the chart of Teresa Novelle Hakimian. Lactation Consultation Note Initial consult:  [place] Interpreter [phone]#[id number] Ex BF, P3, 36 weeks 5 days  Gestation.  Encouraged mother to breastfeed q3. In 15 hours baby has had 4 voids/4 stools and 10 feedings all 20 min or longer. Mother states she knows how to hand express, denies soreness or problems. Reviewed basics, STS, Baby & Me pp 20-24, lactation support services and brochure. Told mother to call to view next feeding.    Patient Name: [name] JXBJY'NToday's Date: 07/23/2013     Maternal Data    Feeding Feeding Type: Breast Fed Length of feed: 30 min  LATCH Score/Interventions                      Lactation Tools Discussed/Used     Consult Status Consult Status: Follow-up Date: 07/24/13 Follow-up type: In-patient    Dahlia ByesBerkelhammer, Ruth [facility] 07/23/2013, 2:36 PM

## 2013-07-24 ENCOUNTER — Ambulatory Visit: Payer: Self-pay

## 2013-07-24 MED ORDER — IBUPROFEN 600 MG PO TABS: 600.0000 mg | ORAL_TABLET | Freq: Four times a day (QID) | ORAL | Status: DC

## 2013-07-24 MED ORDER — OXYCODONE-ACETAMINOPHEN 5-325 MG PO TABS: 1.0000 | ORAL_TABLET | Freq: Four times a day (QID) | ORAL | Status: DC | PRN

## 2013-07-24 NOTE — Discharge Instructions (Signed)
Cuidados en el postparto luego de un parto vaginal  (Postpartum Care After Vaginal Delivery) Despus del parto (perodo de postparto), la estada normal en el hospital es de 24-72 horas. Si hubo problemas con el trabajo de parto o el parto, o si tiene otros problemas mdicos, es posible que Patent attorney en el hospital por ms [place].  Mientras est en el hospital, recibir [place] e instrucciones sobre cmo cuidar de usted misma y de su beb recin nacido durante el postparto.  Mientras est en el hospital:   Asegrese de decirle a las enfermeras si siente dolor o Tree surgeon, as como donde Designer, television/film set y Architect.  Si usted tuvo una incisin cerca de la vagina (episiotoma) o si ha tenido Education officer, museum, las enfermeras le pondrn hielo sobre la episiotoma o Psychiatrist. Las bolsas de hielo pueden ayudar a Dietitian y la hinchazn.  Si est amamantando, puede sentir contracciones dolorosas en el tero durante algunas semanas. Esto es normal. Las contracciones ayudan a que el tero vuelva a su tamao normal.  Es normal tener algo de sangrado despus del [place].  Durante los primeros 1-3 das despus del parto, el flujo es de color rojo y la cantidad puede ser similar a un perodo.  Es frecuente que el flujo se inicie y se Production assistant, radio.  En los primeros [place], puede eliminar algunos cogulos pequeos. Informe a las enfermeras si elimina cogulos grandes o aumenta el flujo.  No  elimine los cogulos de sangre por el inodoro antes de que la [employer] vea.  Durante los prximos 3 a [address] despus del parto, el flujo debe ser ms acuoso y rosado o Forensic psychologist.  Chancy Hurter a catorce [employer] del parto, el flujo debe ser una pequea cantidad de secrecin de color blanco amarillento.  La cantidad de flujo disminuir en las primeras semanas despus del parto. El flujo puede detenerse en 6-8 semanas. La mayora de las mujeres no tienen ms flujo a las 12 semanas  despus del [place].  Usted debe cambiar sus apsitos con frecuencia.  Lvese bien las manos con agua y jabn durante al menos 20 segundos despus de cambiar el apsito, usar el bao o antes de Nature conservation officer o Research scientist (life sciences) a su recin nacido.  Usted podr sentir como que tiene que vaciar la vejiga durante las primeras 6-8 horas despus del [place].  En caso de que sienta debilidad, mareo o [place], llame a la enfermera antes de levantarse de la cama por primera vez y antes de tomar una ducha por primera vez.  Dentro de los [employer] del parto, sus mamas pueden comenzar a estar sensibles y [place]. Esto se llama congestin. La sensibilidad en los senos por lo general desaparece dentro de las 48-72 horas despus de que ocurre la congestin. Tambin puede notar que la [place] se escapa de sus senos. Si no est amamantando no estimule sus pechos. La estimulacin de las mamas hace que sus senos produzcan ms [place].  Pasar tanto tiempo como le sea posible con el beb recin nacido es muy importante. Durante ese tiempo, usted y su beb deben sentirse cerca y conocerse uno al otro. Tener al beb en su habitacin (alojamiento conjunto) ayudar a fortalecer el vnculo con el beb recin nacido.Esto le dar tiempo para conocerlo y atenderlo de [place] cmoda.  Las hormonas se modifican despus del parto. A veces, los cambios hormonales pueden causar tristeza o ganas de llorar por un tiempo. Estos sentimientos  no deben durar ms de unos pocos das. Si duran ms que eso, debe hablar con su mdico.  Si lo desea, hable con su mdico acerca de los mtodos de planificacin familiar o mtodos anticonceptivos.  Hable con su mdico acerca de las vacunas. El mdico puede indicarle que se aplique las siguientes vacunas antes de salir del hospital:  Vacuna contra el ttanos, la difteria y la tos ferina (Tdap) o el ttanos y la difteria (Td). Es muy importante que usted y su familia (incluyendo a los abuelos) u otras  personas que cuidan al recin nacido estn al da con las vacunas Tdap o Td. Las vacunas Tdap o Td pueden ayudar a proteger al recin nacido de enfermedades.  Inmunizacin contra la rubola.  Inmunizacin contra la varicela.  Inmunizacin contra la gripe. Usted debe recibir esta vacunacin anual si no la ha recibido durante el embarazo. Document Released: 02/18/2007 Document Revised: 01/16/2012 ExitCare Patient Information 2014 [employer].  

## 2013-07-24 NOTE — Lactation Note (Signed)
This note was copied from the chart of Teresa Chanci Biehl. Lactation Consultation Note Follow up consult:  [name] 5336 hours old, Late preterm 8759w5d. Eda Present.  Mother complains of sore nipples.  Left nipple cracked, right nipple tender. Provided comfort gels and reviewed use along with hand expressed breastmilk. Reviewed STS, late preterm feeding patterns and breastmilk supplementation guidelines. Recommend mother breastfeed and postpump.  Faxed WIC breastpump form.   Left LC number and asked family to call with next feeding.  Patient Name: [name] WUJWJ'XToday's Date: 07/24/2013 Reason for consult: Follow-up assessment   Maternal Data Formula Feeding for Exclusion: No  Feeding    LATCH Score/Interventions                      Lactation Tools Discussed/Used     Consult Status Consult Status: PRN    Hardie PulleyBerkelhammer, Ariyona Eid Boschen 07/24/2013, 12:05 PM

## 2013-07-24 NOTE — Lactation Note (Signed)
This note was copied from the chart of [name]. Lactation Consultation Note Mother complaining of nipple soreness. Encouraged mother to continue to place baby deep on breast.  FOB undressed baby to wake and changed diaper.  Baby still sleeping.  Family is wanting to go home.  Left Lactation phone number to view latch in am, prior to discharge family did not call until packed ready to go home.  Provided mother with shells and suggest she [facility] spanish speaking [facility] when she picks up her breastpump.  Mother said she would call to make an outpatient appt.   Patient Name: [name] JYNWG'NToday's Date: 07/24/2013 Reason for consult: Follow-up assessment   Maternal Data Formula Feeding for Exclusion: No  Feeding Feeding Type: Breast Fed Length of feed: 35 min  LATCH Score/Interventions                      Lactation Tools Discussed/Used     Consult Status Consult Status: PRN    Hardie PulleyBerkelhammer, Maygen Sirico Boschen 07/24/2013, 2:13 PM

## 2013-07-24 NOTE — Discharge Summary (Signed)
Obstetric Discharge Summary Reason for Admission: onset of labor Prenatal Procedures: ultrasound Intrapartum Procedures: spontaneous vaginal delivery Postpartum Procedures: none Complications-Operative and Postpartum: none Hemoglobin  Date Value Ref Range Status  07/23/2013 12.2  12.0 - 15.0 g/dL Final  1/61/09609/22/2014 45.411.8   Final     HCT  Date Value Ref Range Status  07/23/2013 34.6* 36.0 - 46.0 % Final  01/26/2013 35   Final    Physical Exam:  General: alert, cooperative and no distress Lochia: appropriate Uterine Fundus: firm Incision: N/A DVT Evaluation: No evidence of DVT seen on physical exam. Negative Homan's sign. No cords or calf tenderness. No significant calf/ankle edema.  Discharge Diagnoses: Premature labor and pre-term delivery  Discharge Information: Date: 07/24/2013 Activity: pelvic rest Diet: routine Medications: Ibuprofen Condition: stable Instructions: refer to practice specific booklet Discharge to: home Follow-up Information   [facility].   Specialty:  Obstetrics and Gynecology   Contact information:   [address] [place] [id number] [phone]     Delivery Note At 11:32 PM a viable female was delivered via Vaginal, Spontaneous Delivery (Presentation: Right Occiput Anterior).  APGAR: 8, 9; weight .  Pending skin to skin Placenta status: , .  Cord: 3 vessels with the following complications: None.  Cord pH: not done  Anesthesia: None  Episiotomy: None Lacerations: None Suture Repair: na Est. Blood Loss (mL): 250  Mom to postpartum.  Baby to Nursery Breast feeding.  [name],[name] 07/22/2013, 11:50 PM  Brief Hospital Course: [name] is a [id number] who underwent  SVD 415w5d without complications.  For further details, please refer to delivery/operative note. Uncomplicated postpartum course. At time of discharge, pain was controlled on oral pain medications; she had appropriate lochia and was  ambulating, voiding without difficulty, tolerating regular diet and passing flatus. She was deemed stable for discharge to home.  Newborn Data: Live born female  Birth Weight: 5 lb 12.8 oz (2630 g) APGAR: 8, 9  Home with mother.  [name], [name] 07/24/2013, 9:03 AM  I have seen and examined this patient and agree the above assessment. [name],[name] 07/28/2013 9:51 AM

## 2013-07-27 ENCOUNTER — Encounter (HOSPITAL_COMMUNITY): Payer: Self-pay | Admitting: *Deleted

## 2013-07-30 ENCOUNTER — Encounter: Payer: Self-pay | Admitting: Obstetrics & Gynecology

## 2013-08-19 MED ORDER — SUMATRIPTAN SUCCINATE 25 MG PO TABS: 25.0000 mg | ORAL_TABLET | ORAL | Status: DC | PRN

## 2013-08-19 NOTE — Progress Notes (Signed)
Pt called the front desk and through [place], [place] interpreter, pt informed me that she has been having headaches and would like to have another refill on her percocet cause it worked for her headaches.  I advised pt that the percocet purpose is not for her headaches but for her pain from her delivery.  I asked pt if she had checked her BP she stated that she did and it was normal.  I then asked her if she had a hx of migraines before pregnancy and she stated yes.  Looking at pt's chart it looks like she was taking Imitrex for her migraines.  Pt stated that she stated that they were injections cause the pills did not work.  Per Dr.Odom, pt can an Rx for imitrex 25 mg po 1 tablet if ineffective can take another in 2 hours, qty 20 with no refill to hold her over to her pp visit on 08/26/13.  Pt stated understanding to the recommendation with no further questions.

## 2013-08-26 ENCOUNTER — Ambulatory Visit (INDEPENDENT_AMBULATORY_CARE_PROVIDER_SITE_OTHER): Payer: Self-pay | Admitting: Advanced Practice Midwife

## 2013-08-26 ENCOUNTER — Encounter: Payer: Self-pay | Admitting: Advanced Practice Midwife

## 2013-08-26 VITALS — BP 122/80 | HR 79 | Temp 96.3°F | Ht 65.0 in | Wt 130.5 lb

## 2013-08-26 DIAGNOSIS — Z8742 Personal history of other diseases of the female genital tract: Secondary | ICD-10-CM

## 2013-08-26 DIAGNOSIS — IMO0001 Reserved for inherently not codable concepts without codable children: Secondary | ICD-10-CM

## 2013-08-26 DIAGNOSIS — G43909 Migraine, unspecified, not intractable, without status migrainosus: Secondary | ICD-10-CM

## 2013-08-26 NOTE — Progress Notes (Signed)
Patient reports pain around navel since she delivered and worsens when she coughs or laughs.

## 2013-08-26 NOTE — Patient Instructions (Signed)
Elección del método anticonceptivo  (Contraception Choices)  La anticoncepción (control de la natalidad) es el uso de cualquier método o dispositivo para evitar el embarazo. A continuación se indican algunos de esos métodos.  MÉTODOS HORMONALES   · El Implante contraconceptivo consiste en un tubo plástico delgado que contiene la hormona progesterona. No contiene estrógenos. El médico inserta el tubo en la parte interna del brazo. El tubo puede permanecer en el lugar durante 3 años. Después de los 3 años debe retirarse. El implante impide que los ovarios liberen óvulos (ovulación), espesa el moco cervical, lo que evita que los espermatozoides ingresen al útero y hace más delgada la membrana que cubre el interior del útero.  · Inyecciones de progesterona sola: las administra el médico cada 3 meses para evitar el embarazo. La progesterona sintética impide que los ovarios liberen óvulos. También hacen que el moco cervical se espese y modifique el tejido de recubrimiento interno del útero. Esto hace más difícil que los espermatozoides sobrevivan en el útero.  · Las píldoras anticonceptivas contienen estrógenos y progesterona. Su función es evitar que los ovarios liberen óvulos (ovulación). Las hormonas de los anticonceptivos orales hacen que el moco cervical se haga más espeso, lo que evita que el esperma ingrese al útero. Las píldoras anticonceptivas son recetadas por el médico. También se utilizan para tratar los períodos menstruales abundantes.  · Minipíldora: este tipo de píldora anticonceptiva contiene sólo hormona progesterona. Deben tomarse todos los días del mes y debe recetarlas el médico.  · El parche de control de natalidad: contiene hormonas similares a las que contienen las píldoras anticonceptivas. Deben cambiarse una vez por semana y se utilizan bajo prescripción médica.  · Anillo vaginal: contiene hormonas similares a las que contienen las píldoras anticonceptivas. Se deja colocado durante tres semanas,  se lo retira durante 1 semana y luego se coloca uno nuevo. La paciente debe sentirse cómoda al insertar y retirar el anillo de la vagina. Es necesaria la prescripción médica.  · Anticonceptivos de emergencia: son métodos para evitar un embarazo después de una relación sexual sin protección. Esta píldora puede tomarse inmediatamente después de tener relaciones sexuales o hasta 5 días de haber tenido sexo sin protección. Es más efectiva si se toma poco tiempo después de la relación sexual. Los anticonceptivos de emergencia están disponibles sin prescripción médica. Consúltelo con su farmacéutico. No use los anticonceptivos de emergencia como único método anticonceptivo.  MÉTODOS DE BARRERA   · Condón masculino: es una vaina delgada (látex o goma) que se coloca cubriendo al pene durante el acto sexual. Puede usarse con espermicida para aumentar la efectividad.  · Condón femenino. Es una funda delicada y blanda que se adapta holgadamente a la vagina antes de las relaciones sexuales.  · Diafragma: es una barrera de látex redonda y suave que debe ser recomendado por un profesional. Se inserta en la vagina, junto con un gel espermicida. Debe insertarse antes de tener relaciones sexuales. Debe dejar el diafragma colocado en la vagina durante 6 a 8 horas después de la relación sexual.  · Capuchón cervical: es una barrera de látex o taza plástica redonda y suave que cubre el cuello del útero y debe ser colocada por un médico. Puede dejarlo colocado en la vagina hasta 48 horas después de las relaciones sexuales.  · Esponja: es una pieza blanda y circular de espuma de poliuretano. Contiene un espermicida. Se inserta en la vagina después de mojarla y antes de las relaciones sexuales.  · Espermicidas: son sustancias químicas que matan   o bloquean al esperma y no lo dejan ingresar al cuello del útero y al útero. Vienen en forma de cremas, geles, supositorios, espuma o comprimidos. No es necesario tener receta médica. Se insertan en  la vagina con un aplicador antes de tener relaciones sexuales. El proceso debe repetirse cada vez que tiene relaciones sexuales.  ANTICONCEPTIVOS INTRAUTERINOS  · Dispositivo intrauterino (DIU) es un dispositivo en forma de T que se coloca en el útero durante el período menstrual, para evitar el embarazo. Hay dos tipos:  · DIU de cobre: este tipo de DIU está recubierto con un alambre de cobre y se inserta dentro del útero. El cobre hace que el útero y las trompas de Falopio produzcan un liquido que destruye los espermatozoides. Puede permanecer colocado durante 10 años.  · DIU con hormona: este tipo de DIU contiene la hormona progestina (progesterona sintética). La hormona espesa el moco cervical y evita que los espermatozoides ingresen al útero y también afina la membrana que cubre el útero para evitar la implantación del óvulo fertilizado. La hormona debilita o destruye los espermatozoides que ingresan al útero. Puede permanecer en el lugar durante 3 5 años, según el tipo de DIU que se utilice.  MÉTODOS ANTICONCEPTIVOS PERMANENTES  · Ligadura de trompas en la mujer: se realiza sellando, atando u obstruyendo quirúrgicamente las trompas de Falopio lo que impide que el óvulo descienda hacia el útero.  · Esterilización histeroscópica: Implica la colocación de un pequeño espiral o la inserción en cada trompa de Falopio. El médico utiliza una técnica llamada histeroscopía para realizar este procedimiento. El dispositivo produce la formación de tejido cicatrizal. Esto da como resultado una obstrucción permanente de las trompas de Falopio, de modo que la esperma no pueda fertilizar el óvulo. Demora alrededor de 3 meses después del procedimiento hasta que el conducto se obstruye. Tendrá que usar otro método anticonceptivo durante al menos 3 meses.  · Esterilización masculina: se realiza ligando los conductos por los que pasan los espermatozoides (vasectomía).Esto impide que el esperma ingrese a la vagina durante el acto  sexual. Luego del procedimiento, el hombre puede eyacular líquido (semen).  MÉTODOS DE PLANIFICACIÓN NATURAL  · Planificación familiar natural: consiste en no tener relaciones sexuales o usar un método de barrera (condón, diafragma, capuchón cervical) en los días que la mujer podría quedar embarazada.  · Método de calendario: consiste en el seguimiento de la duración de cada ciclo menstrual y la identificación de los períodos fértiles.  · Método de ovulación: consiste en evitar las relaciones sexuales durante la ovulación.  · Método sintotérmico: consiste en evitar las relaciones sexuales en la época en la que se está ovulando, utilizando un termómetro y tendiendo en cuenta los síntomas de la ovulación.  · Método postovulación: consiste en planificar las relaciones sexuales para después de haber ovulado.  Independientemente del tipo o método anticonceptivo que usted elija, es importante que use condones para protegerse contra las infecciones de transmisión sexual (ETS). Hable con su médico con respecto a qué método anticonceptivo es el más apropiado para usted.  Document Released: 04/23/2005 Document Revised: 12/24/2012  ExitCare® Patient Information ©2014 Exit[employer] LLC.

## 2013-08-26 NOTE — Progress Notes (Signed)
  Subjective:     [name] is a 38 y.o. female who presents for a postpartum visit. She is 4 weeks postpartum following a spontaneous vaginal delivery. I have fully reviewed the prenatal and intrapartum course. The delivery was at term. Outcome: spontaneous vaginal delivery. Anesthesia: none. Postpartum course has been uneventful. Baby's course has been uneventful. Baby is feeding by breast. Bleeding no bleeding. Bowel function is normal. Bladder function is normal. Patient is not sexually active. Contraception method is none. Postpartum depression screening: negative.  The following portions of the patient's history were reviewed and updated as appropriate: allergies, current medications, past family history, past medical history, past social history, past surgical history and problem list.  C/O pain at umbilicus at times. Also has migraines, but did not have money for Imitrex. Wants more Percocet for umbilical pain.   Review of Systems Pertinent items are noted in HPI.   Objective:    BP 122/80  Pulse 79  Temp(Src) 96.3 F (35.7 C) (Oral)  Ht 5\' 5"  (1.651 m)  Wt 59.194 kg (130 lb 8 oz)  BMI 21.72 kg/m2  Breastfeeding? Yes  General:  alert, cooperative and no distress   Breasts:  inspection negative, no nipple discharge or bleeding, no masses or nodularity palpable  Lungs: Respirations unlabored  Heart:  normal rate  Abdomen: soft, non-tender; bowel sounds normal; no masses,  no organomegaly  No sign of umbilical hernia   Vulva:  not evaluated  Vagina: not evaluated  Cervix:  n/a  Corpus: not examined  Adnexa:  not evaluated  Rectal Exam: Not performed.        Assessment:     Normal postpartum exam. Pap smear not done at today's visit.   Plan:    1. Contraception: Undecided. May want depo or pills. [facility] 2. Discussed to give umbilical pain some time to improve. Do not want to give more Percocet, not good for baby and can be  addictive. If umbilical pain continues will need eval by other doctor.  3. Follow up in: 1 year or as needed.

## 2013-12-15 ENCOUNTER — Ambulatory Visit: Payer: Medicaid Other

## 2013-12-30 ENCOUNTER — Encounter: Payer: Self-pay | Admitting: General Practice

## 2014-03-08 ENCOUNTER — Encounter: Payer: Self-pay | Admitting: Advanced Practice Midwife

## 2014-08-11 ENCOUNTER — Encounter (HOSPITAL_COMMUNITY): Payer: Self-pay

## 2014-08-11 ENCOUNTER — Emergency Department (HOSPITAL_COMMUNITY)
Admission: EM | Admit: 2014-08-11 | Discharge: 2014-08-11 | Disposition: A | Discharge status: Home / Self Care | Payer: No Typology Code available for payment source | Attending: Emergency Medicine | Admitting: Emergency Medicine

## 2014-08-11 ENCOUNTER — Emergency Department (HOSPITAL_COMMUNITY): Payer: No Typology Code available for payment source

## 2014-08-11 DIAGNOSIS — Z79899 Other long term (current) drug therapy: Secondary | ICD-10-CM | POA: Insufficient documentation

## 2014-08-11 DIAGNOSIS — S39012A Strain of muscle, fascia and tendon of lower back, initial encounter: Secondary | ICD-10-CM | POA: Insufficient documentation

## 2014-08-11 DIAGNOSIS — Z3202 Encounter for pregnancy test, result negative: Secondary | ICD-10-CM | POA: Insufficient documentation

## 2014-08-11 DIAGNOSIS — Z8679 Personal history of other diseases of the circulatory system: Secondary | ICD-10-CM | POA: Insufficient documentation

## 2014-08-11 DIAGNOSIS — Y92410 Unspecified street and highway as the place of occurrence of the external cause: Secondary | ICD-10-CM | POA: Diagnosis not present

## 2014-08-11 DIAGNOSIS — T148 Other injury of unspecified body region: Secondary | ICD-10-CM | POA: Insufficient documentation

## 2014-08-11 DIAGNOSIS — T07XXXA Unspecified multiple injuries, initial encounter: Secondary | ICD-10-CM

## 2014-08-11 DIAGNOSIS — Y9389 Activity, other specified: Secondary | ICD-10-CM | POA: Diagnosis not present

## 2014-08-11 DIAGNOSIS — S59902A Unspecified injury of left elbow, initial encounter: Secondary | ICD-10-CM | POA: Diagnosis not present

## 2014-08-11 DIAGNOSIS — Y998 Other external cause status: Secondary | ICD-10-CM | POA: Diagnosis not present

## 2014-08-11 DIAGNOSIS — S3992XA Unspecified injury of lower back, initial encounter: Secondary | ICD-10-CM | POA: Diagnosis present

## 2014-08-11 LAB — PREGNANCY, URINE: Preg Test, Ur: NEGATIVE

## 2014-08-11 MED ORDER — IBUPROFEN 800 MG PO TABS
800.0000 mg | ORAL_TABLET | Freq: Once | ORAL | Status: AC
  Administered 2014-08-11: 800 mg via ORAL
  Filled 2014-08-11: qty 1

## 2014-08-11 MED ORDER — IBUPROFEN 600 MG PO TABS: 600.0000 mg | ORAL_TABLET | Freq: Four times a day (QID) | ORAL | Status: AC | PRN

## 2014-08-11 NOTE — ED Provider Notes (Signed)
xrays reviewed all negative  Earley FavorGail Espiridion Supinski, NP 08/11/14 2223  Raeford RazorStephen Kohut, MD 08/11/14 [phone]
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
## 2014-08-19 IMAGING — US US OB DETAIL+14 WK
1 series · 12 of 28 positions shown · non-contrast
Comparison: none

[Series 1: us ob detail+14 wk · 12 of 70 slices shown]
[im 3/70]
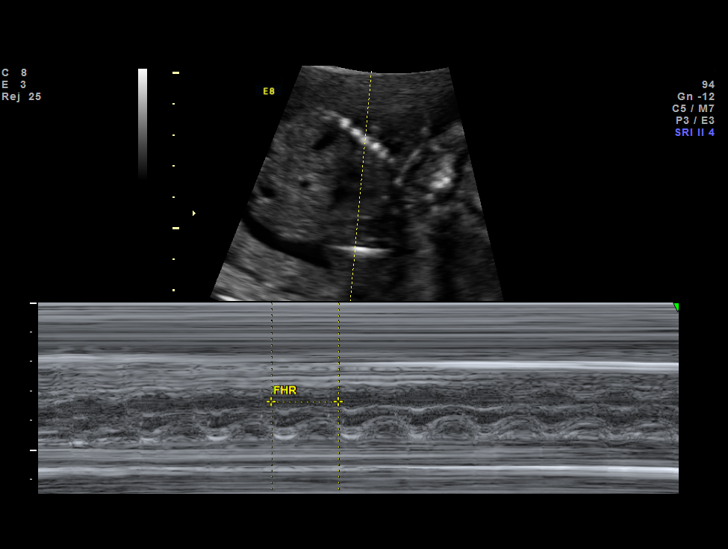
[im 8/70]
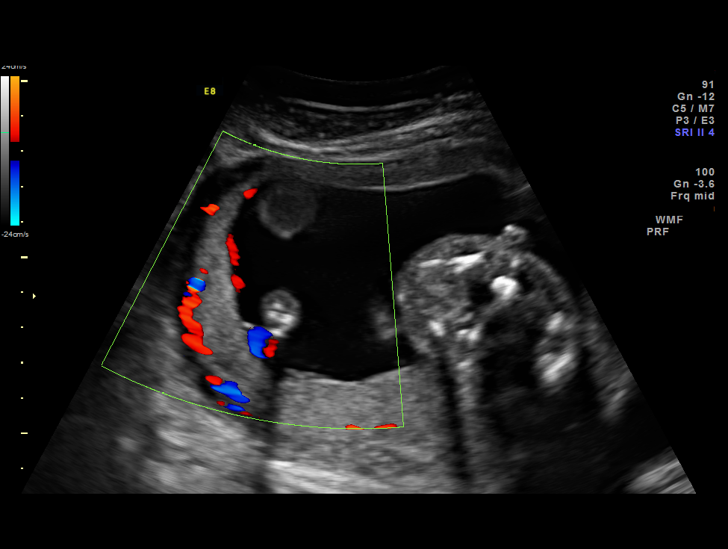
[im 13/70]
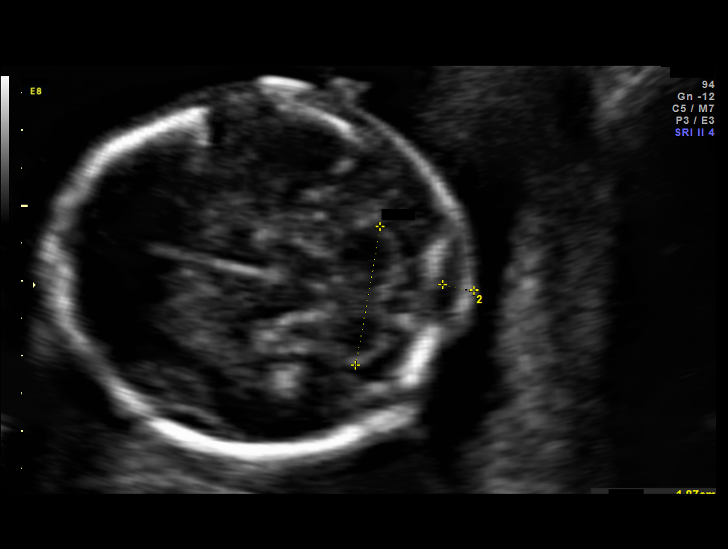
[im 21/70]
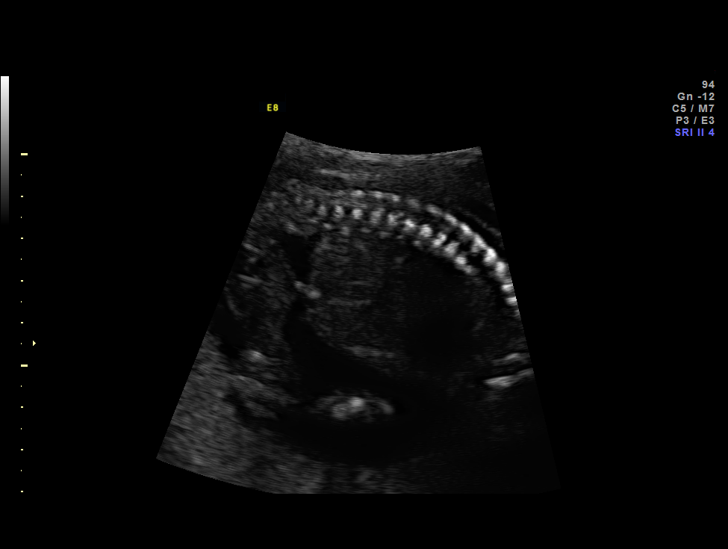
[im 26/70]
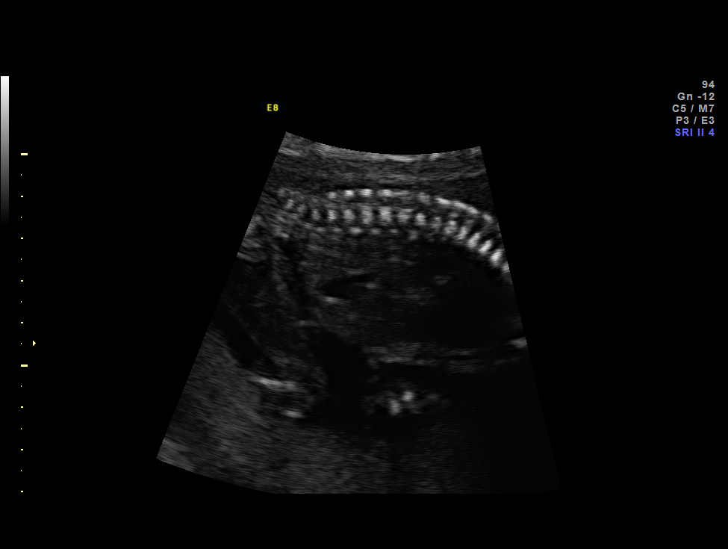
[im 31/70]
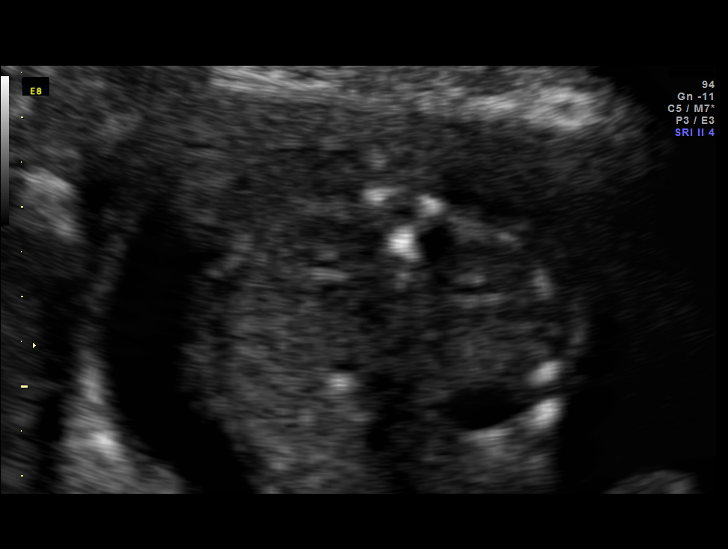
[im 39/70]
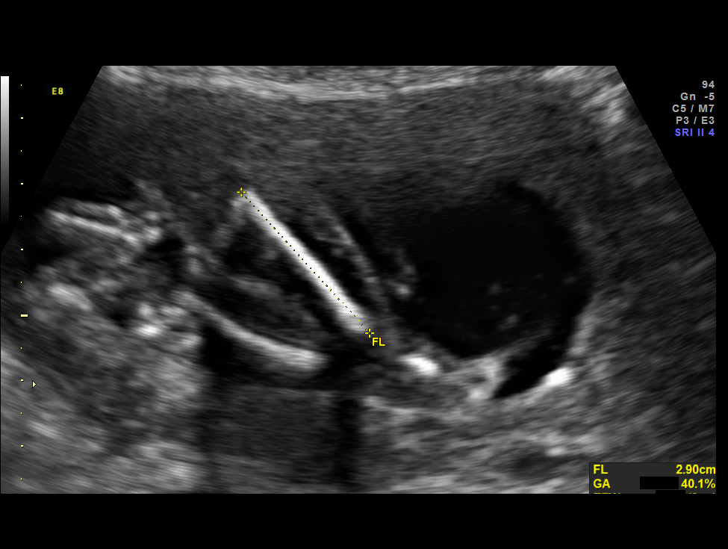
[im 44/70]
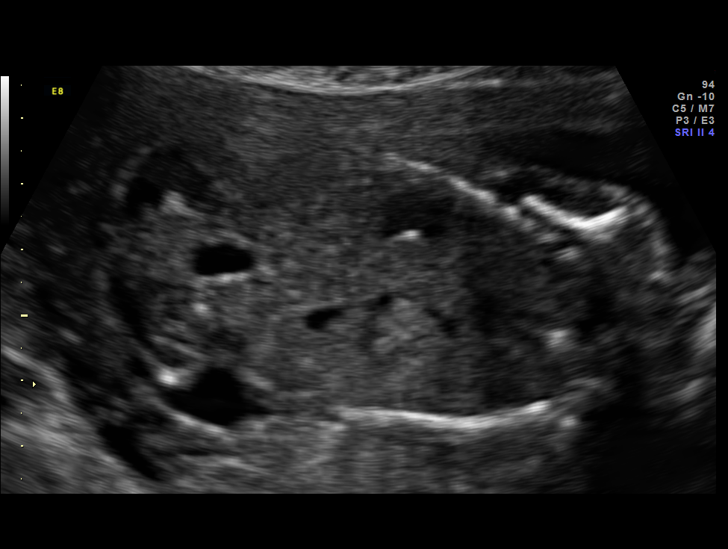
[im 49/70]
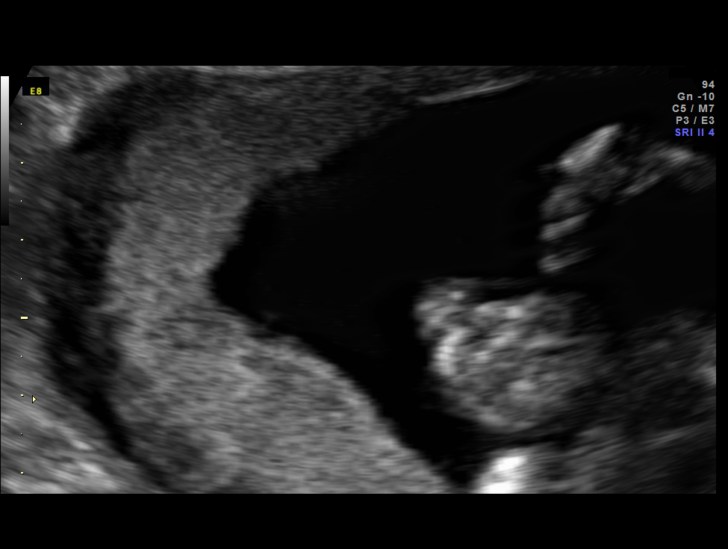
[im 57/70]
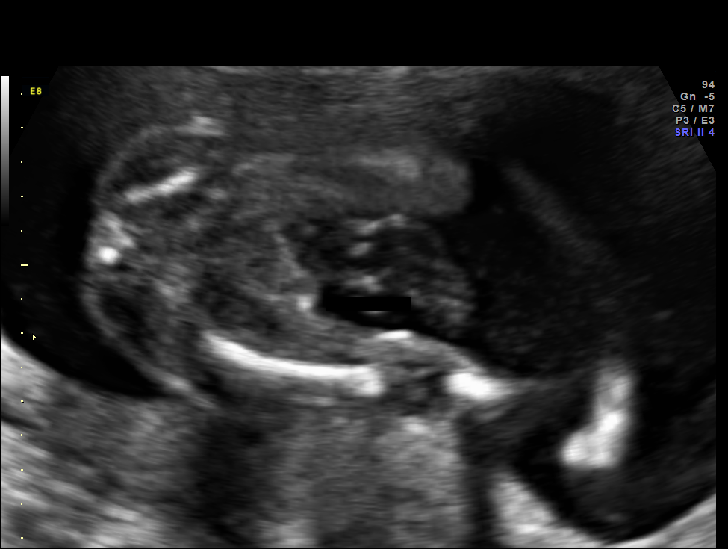
[im 62/70]
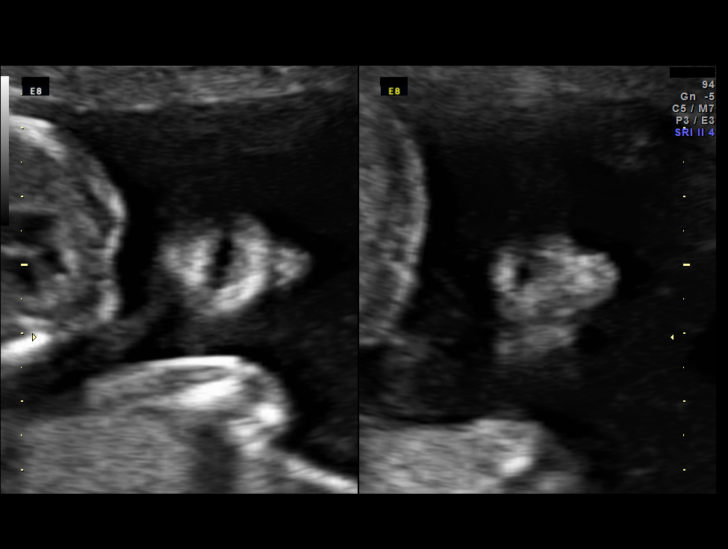
[im 67/70]
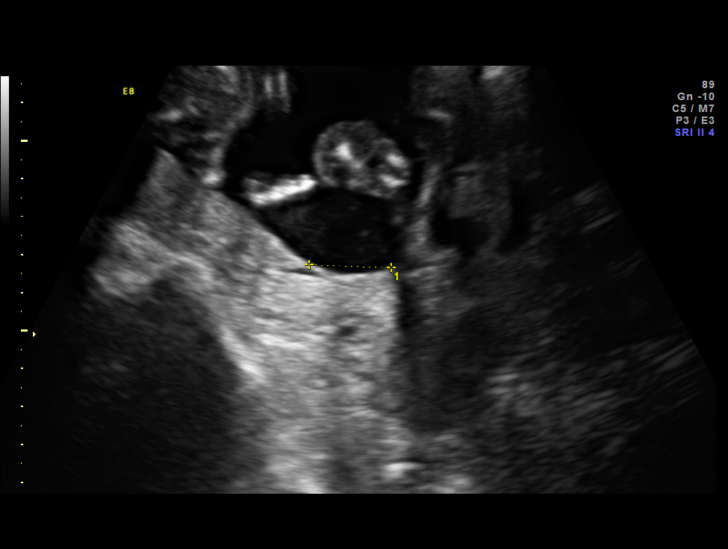

[12 of 28 positions shown; findings below may reference images not displayed]

OBSTETRICS REPORT
                      (Signed Final 03/20/2013 [DATE])

Service(s) Provided

 US OB DETAIL + 14 WK                                  76811.0
Indications

 Advanced maternal age (36), Multigravida; NIPS
 low risk
 Poor obstetric history: Previous preterm delivery;
 17P
 Previous cervical surgery                             [DATE]
 Detailed fetal anatomic survey
Fetal Evaluation

 Num Of Fetuses:    1
 Fetal Heart Rate:  150                          bpm
 Cardiac Activity:  Observed
 Presentation:      Variable
 Placenta:          Posterior, above cervical
                    os
 P. Cord            Visualized, central
 Insertion:

 Amniotic Fluid
 AFI FV:      Subjectively within normal limits
                                             Larg Pckt:     4.8  cm
Biometry

 BPD:     47.3  mm     G. Age:  20w 2d                CI:         82.3   70 - 86
 OFD:     57.5  mm                                    FL/HC:      17.3   16.1 -

 HC:     167.6  mm     G. Age:  19w 3d       63  %    HC/AC:      1.25   1.09 -

 AC:     133.7  mm     G. Age:  18w 6d       41  %    FL/BPD:
 FL:        29  mm     G. Age:  18w 6d       40  %    FL/AC:      21.7   20 - 24
 HUM:     28.4  mm     G. Age:  19w 1d       55  %
 CER:     18.7  mm     G. Age:  18w 2d       31  %

 Est. FW:     267  gm      0 lb 9 oz     44  %
Gestational Age

 LMP:           19w 0d        Date:  11/07/12                 EDD:   08/14/13
 U/S Today:     19w 3d                                        EDD:   08/11/13
 Best:          19w 0d     Det. By:  LMP  (11/07/12)          EDD:   08/14/13
Anatomy

 Cranium:          Appears normal         Aortic Arch:      Appears normal
 Fetal Cavum:      Appears normal         Ductal Arch:      Appears normal
 Ventricles:       Appears normal         Diaphragm:        Appears normal
 Choroid Plexus:   Appears normal         Stomach:          Appears normal, left
                                                            sided
 Cerebellum:       Appears normal         Abdomen:          Appears normal
 Posterior Fossa:  Appears normal         Abdominal Wall:   Appears nml (cord
                                                            insert, abd wall)
 Nuchal Fold:      Not applicable (>20    Cord Vessels:     Appears normal (3
                   wks GA)                                  vessel cord)
 Face:             Appears normal         Kidneys:          Appear normal
                   (orbits and profile)
 Lips:             Appears normal         Bladder:          Appears normal
 Heart:            Appears normal         Spine:            Appears normal
                   (4CH, axis, and
                   situs)
 RVOT:             Appears normal         Lower             Appears normal
                                          Extremities:
 LVOT:             Appears normal         Upper             Appears normal
                                          Extremities:

 Other:  Fetus appears to be a male. Heels and 5th digit visualized. Nasal
         bone visualized. Technically difficult due to fetal position.
Cervix Uterus Adnexa

 Cervical Length:    3.96     cm

 Cervix:       Normal appearance by transabdominal scan.

 Adnexa:     No abnormality visualized.
Impression

 IUP at 19+0 weeks
 Normal detailed fetal anatomy
 Markers of aneuploidy: none
 Normal amniotic fluid volume
 Measurements consistent with LMP dating
Recommendations

 Follow-up ultrasound for growth in 8 weeks (AMA)

## 2014-12-08 IMAGING — US US OB FOLLOW-UP
1 series · 12 of 28 positions shown · non-contrast
Comparison: none

[Series 1: us ob follow-up · 0.23mm/px · 12 of 29 slices shown]
[im 2/29]
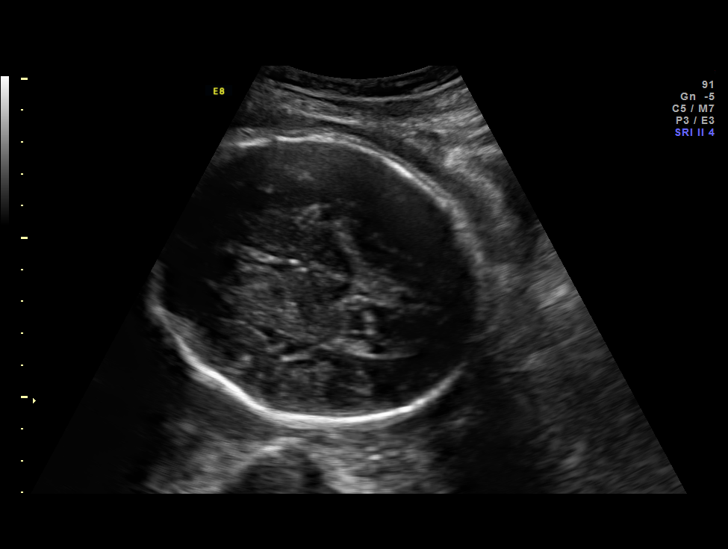
[im 4/29]
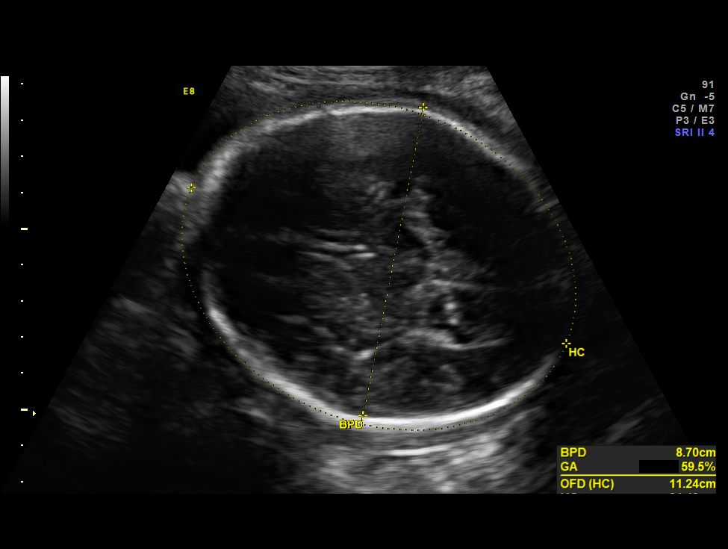
[im 6/29]
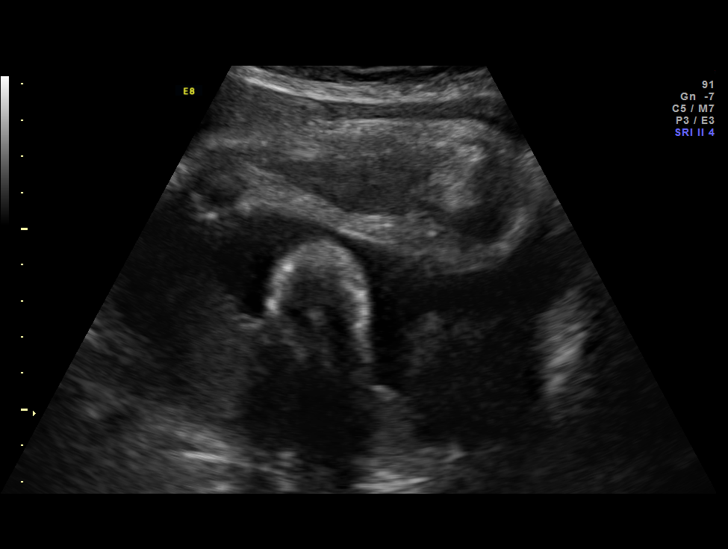
[im 9/29]
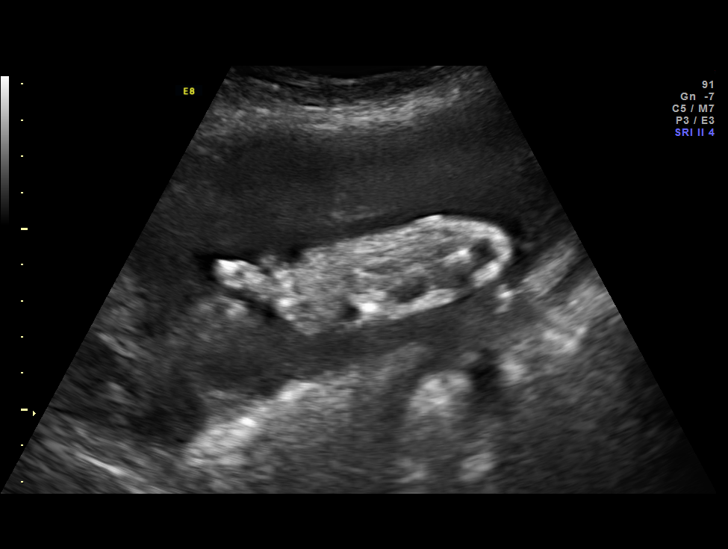
[im 11/29]
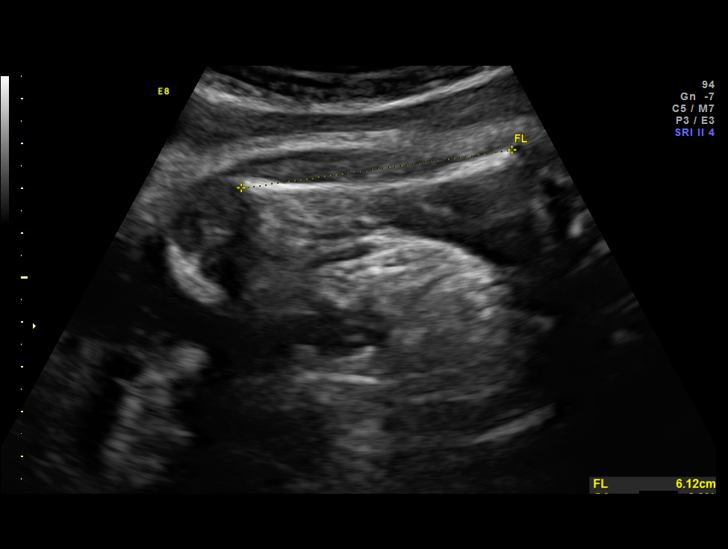
[im 13/29]
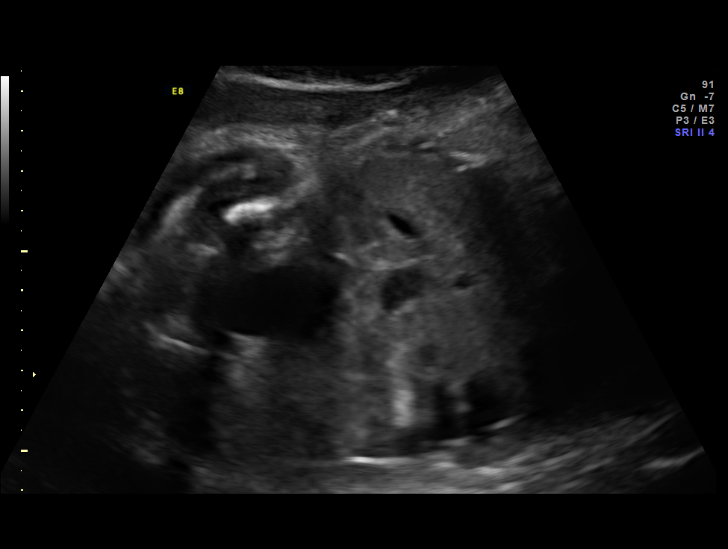
[im 16/29]
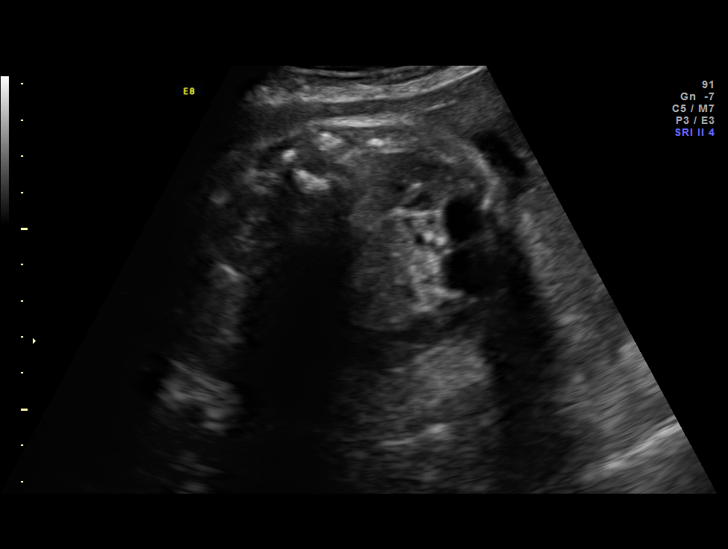
[im 18/29]
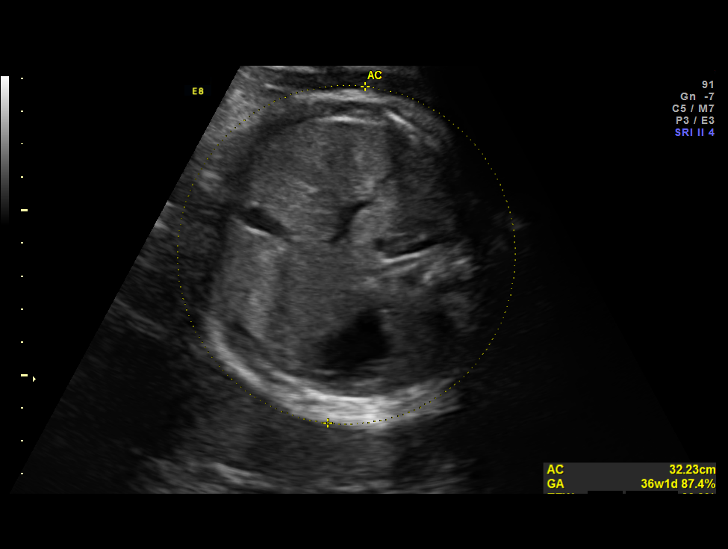
[im 20/29]
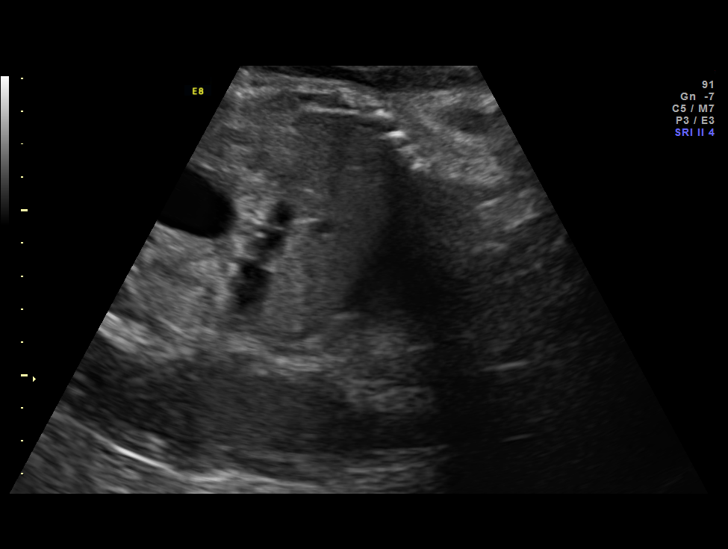
[im 23/29]
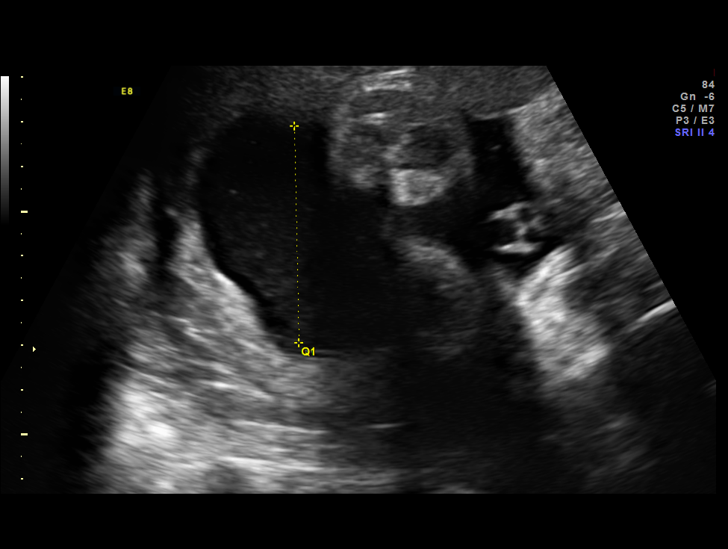
[im 25/29]
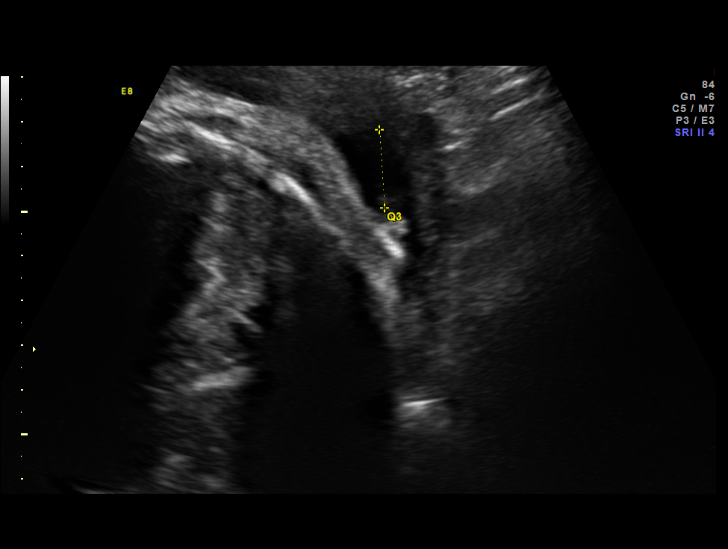
[im 27/29]
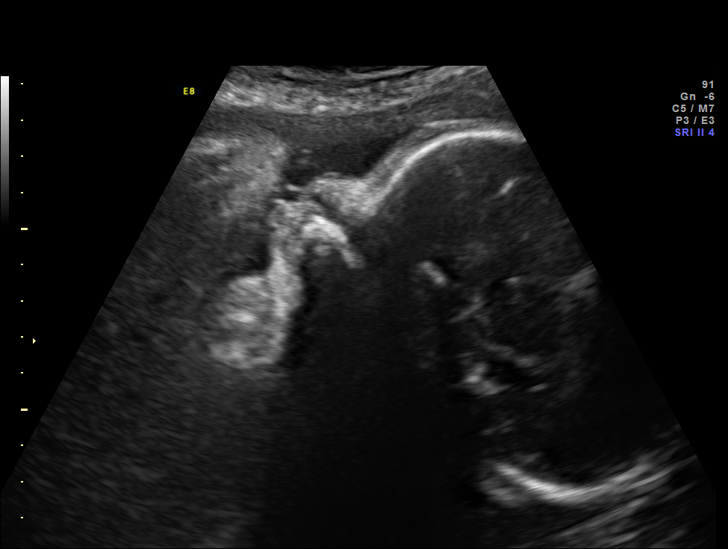

[12 of 28 positions shown; findings below may reference images not displayed]

OBSTETRICS REPORT
                      (Signed Final 07/09/2013 [DATE])

Service(s) Provided

 US OB FOLLOW UP                                       76816.1
Indications

 Advanced maternal age (36), Multigravida; NIPS
 low risk
 Poor obstetric history: Previous preterm delivery;
 17P
 Previous cervical surgery                             [DATE]
Fetal Evaluation

 Num Of Fetuses:    1
 Fetal Heart Rate:  155                          bpm
 Cardiac Activity:  Observed
 Presentation:      Cephalic
 Placenta:          Posterior, above cervical
                    os
 P. Cord            Previously Visualized
 Insertion:

 Amniotic Fluid
 AFI FV:      Subjectively within normal limits
 AFI Sum:     11.33   cm       30  %Tile     Larg Pckt:    4.86  cm
 RUQ:   4.86    cm   RLQ:    2.22   cm    LUQ:   2.49    cm   LLQ:    1.76   cm
Biometry

 BPD:     86.8  mm     G. Age:  35w 0d                CI:         76.7   70 - 86
 OFD:    113.1  mm                                    FL/HC:      19.3   20.1 -

 HC:     318.9  mm     G. Age:  35w 6d       42  %    HC/AC:      1.00   0.93 -

 AC:     319.9  mm     G. Age:  35w 6d       82  %    FL/BPD:     70.7   71 - 87
 FL:      61.4  mm     G. Age:  31w 6d      < 3  %    FL/AC:      19.2   20 - 24
 HUM:     56.9  mm     G. Age:  33w 0d       28  %

 Est. FW:    7548  gm      5 lb 9 oz     59  %
Gestational Age

 LMP:           34w 6d        Date:  11/07/12                 EDD:   08/14/13
 U/S Today:     34w 5d                                        EDD:   08/15/13
 Best:          34w 6d     Det. By:  LMP  (11/07/12)          EDD:   08/14/13
Anatomy

 Cranium:          Previously seen        Aortic Arch:      Previously seen
 Fetal Cavum:      Previously seen        Ductal Arch:      Previously seen
 Ventricles:       Appears normal         Diaphragm:        Appears normal
 Choroid Plexus:   Previously seen        Stomach:          Appears normal, left
                                                            sided
 Cerebellum:       Previously seen        Abdomen:          Previously seen
 Posterior Fossa:  Previously seen        Abdominal Wall:   Previously seen
 Nuchal Fold:      Not applicable (>20    Cord Vessels:     Previously seen
                   wks GA)
 Face:             Orbits and profile     Kidneys:          Appear normal
                   previously seen
 Lips:             Previously seen        Bladder:          Appears normal
 Heart:            Appears normal         Spine:            Previously seen
                   (4CH, axis, and
                   situs)
 RVOT:             Previously seen        Lower             Previously seen
                                          Extremities:
 LVOT:             Previously seen        Upper             Previously seen
                                          Extremities:

 Other:  Male gender. Heels and 5th digit previously seen.
Cervix Uterus Adnexa

 Cervix:       Not visualized (advanced GA >11wks)
Impression

 Single IUP at 34 [DATE] weeks
 Interval fetal growth is appropriate (59th %tile)
 Normal interval anatomy
 Normal amniotic fluid volume
Recommendations

 Follow-up ultrasounds as clinically indicated.

## 2015-11-23 ENCOUNTER — Encounter (HOSPITAL_COMMUNITY): Payer: Self-pay | Admitting: *Deleted

## 2016-03-24 ENCOUNTER — Ambulatory Visit (INDEPENDENT_AMBULATORY_CARE_PROVIDER_SITE_OTHER): Payer: Self-pay | Admitting: Physician Assistant

## 2016-03-24 DIAGNOSIS — G43919 Migraine, unspecified, intractable, without status migrainosus: Secondary | ICD-10-CM

## 2016-03-24 MED ORDER — PROMETHAZINE HCL 25 MG/ML IJ SOLN: 50.0000 mg | Freq: Once | INTRAMUSCULAR | Status: DC

## 2016-03-24 MED ORDER — KETOROLAC TROMETHAMINE 60 MG/2ML IM SOLN
60.0000 mg | Freq: Once | INTRAMUSCULAR | Status: AC
Start: 2016-03-24 — End: 2016-03-24
  Administered 2016-03-24: 60 mg via INTRAMUSCULAR

## 2016-03-24 MED ORDER — PROMETHAZINE HCL 25 MG PO TABS: 25.0000 mg | ORAL_TABLET | Freq: Three times a day (TID) | ORAL | 5 refills | Status: AC | PRN

## 2016-03-24 MED ORDER — PROMETHAZINE HCL 25 MG/ML IJ SOLN
50.0000 mg | Freq: Once | INTRAMUSCULAR | Status: AC
  Administered 2016-03-24: 50 mg via INTRAMUSCULAR

## 2016-03-24 MED ORDER — SUMATRIPTAN SUCCINATE 50 MG PO TABS: 50.0000 mg | ORAL_TABLET | ORAL | 0 refills | Status: DC | PRN

## 2016-03-24 MED ORDER — PROMETHAZINE HCL 25 MG PO TABS: 25.0000 mg | ORAL_TABLET | Freq: Three times a day (TID) | ORAL | 5 refills | Status: DC | PRN

## 2016-03-24 NOTE — Patient Instructions (Addendum)
In the future, take ibuprofen as soon as headache appears. You can use imitrex for migraines, max is two per day.  I have given you prescription for phenergan to use for nausea.   I have also give referral to headache clinic. They will call you with an appointment and may be able to help reduce frequency of your migraines.  -Return to clinic if symptoms worsen, do not improve, or as needed    IF you received an x-ray today, you will receive an invoice from [facility]. Please contact [facility] at [phone] with questions or concerns regarding your invoice.   IF you received labwork today, you will receive an invoice from [facility]. Please contact [facility] at [phone] with questions or concerns regarding your invoice.   Our billing staff will not be able to assist you with questions regarding bills from these companies.  You will be contacted with the lab results as soon as they are available. The fastest way to get your results is to activate your My Chart account. Instructions are located on the last page of this paperwork. If you have not heard from us regarding the results in 2 weeks, please contact this office.

## 2016-03-24 NOTE — Progress Notes (Signed)
Teresa OysterMayaciel B Esparza  MRN: [id number] DOB: Jun 04, 1975  Subjective:  [name] is a 40 y.o. female seen in office today for a chief complaint of migraine since last night. Has associated photophovbia, phonophobia, nausea, and vomiting. She has tried advil last night and this morning with no relief.  She has a history of migraines for over 20 years. Her last migraine was last month.  She has had imitrex tablets in the past and these work really well for her but she has ran out of these so she has been going to urgent cares every months when she has a migraine. Her last prescription for ten tablets was 11/04/15.   Review of Systems  Constitutional: Negative for chills, diaphoresis and fever.  Neurological: Positive for dizziness. Negative for speech difficulty, weakness and numbness.    Patient Active Problem List   Diagnosis Date Noted  . Active labor 07/22/2013  . 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 History of prior pregnancy with small for gestational age newborn 02/09/2013  . History of cervical LEEP biopsy affecting care of mother, antepartum 02/09/2013  . Migraine with aura 04/20/2011    Current Outpatient Prescriptions on File Prior to Visit  Medication Sig Dispense Refill  . ibuprofen (ADVIL,MOTRIN) 600 MG tablet Take 1 tablet (600 mg total) by mouth every 6 (six) hours as needed. 30 tablet 0  . acetaminophen (TYLENOL) 500 MG tablet Take 1,000 mg by mouth every 6 (six) hours as needed for headache (headache).     No current facility-administered medications on file prior to visit.     No Known Allergies   Objective:  BP 108/88 (BP Location: Right Arm, Patient Position: Sitting, Cuff Size: Small)   Pulse 95   Temp 97.8 F (36.6 C) (Oral)   Resp 16   Ht 5\' 5"  (1.651 m)   Wt 122 lb 3.2 oz (55.4 kg)   LMP 03/20/2016   SpO2 100%   BMI 20.34 kg/m   Physical Exam  Constitutional: She is oriented to person, place, and time.  Pt lying on exam  table, crying and holding her head in between her hands in fetal position, with lights dimmed.   HENT:  Head: Normocephalic and atraumatic.  Eyes: Conjunctivae and EOM are normal. Pupils are equal, round, and reactive to light.  Neck: Normal range of motion.  Pulmonary/Chest: Effort normal.  Neurological: She is alert and oriented to person, place, and time. She has intact cranial nerves. Gait normal.  Skin: Skin is warm and dry.  Psychiatric: Affect normal.  Vitals reviewed.    Assessment and Plan :  1. Intractable migraine without status migrainosus, unspecified migraine type Instructed to try and use naproxen or ibuprofen at migraine onset. Given prescription for imitrex as this may provide more relief for patient. Instructed that if symptoms get worse or return seek care immediately. - ketorolac (TORADOL) injection 60 mg; Inject 2  mLs (60 mg total) into the muscle once. - AMB referral to headache clinic - promethazine (PHENERGAN) 25 MG tablet; Take 1 tablet (25 mg total) by mouth every 8 (eight) hours as needed for nausea or vomiting.  Dispense: 20 tablet; Refill: 5 - SUMAtriptan (IMITREX) 50 MG tablet; Take 1 tablet (50 mg total) by mouth every 2 (two) hours as needed for migraine. May repeat in 2 hours if headache persists or recurs.  Dispense: 10 tablet; Refill: 0 - promethazine (PHENERGAN) injection 50 mg; Inject 2 mLs (50 mg total) into the muscle once.  Benjiman CoreBrittany Khayree Delellis PA-C  [facility] 03/24/2016 4:08 PM

## 2016-07-15 DIAGNOSIS — J111 Influenza due to unidentified influenza virus with other respiratory manifestations: Secondary | ICD-10-CM | POA: Insufficient documentation

## 2016-07-15 DIAGNOSIS — J40 Bronchitis, not specified as acute or chronic: Secondary | ICD-10-CM | POA: Insufficient documentation

## 2016-07-16 ENCOUNTER — Encounter (HOSPITAL_COMMUNITY): Payer: Self-pay | Admitting: Oncology

## 2016-07-16 ENCOUNTER — Emergency Department (HOSPITAL_COMMUNITY)
Admission: EM | Admit: 2016-07-16 | Discharge: 2016-07-16 | Disposition: A | Discharge status: Home / Self Care | Payer: Self-pay | Attending: Emergency Medicine | Admitting: Emergency Medicine

## 2016-07-16 ENCOUNTER — Emergency Department (HOSPITAL_COMMUNITY): Payer: Self-pay

## 2016-07-16 DIAGNOSIS — J111 Influenza due to unidentified influenza virus with other respiratory manifestations: Secondary | ICD-10-CM

## 2016-07-16 DIAGNOSIS — R69 Illness, unspecified: Secondary | ICD-10-CM

## 2016-07-16 DIAGNOSIS — J40 Bronchitis, not specified as acute or chronic: Secondary | ICD-10-CM

## 2016-07-16 LAB — I-STAT CG4 LACTIC ACID, ED: Lactic Acid, Venous: 1.71 mmol/L (ref 0.5–1.9)

## 2016-07-16 LAB — URINALYSIS, ROUTINE W REFLEX MICROSCOPIC
Bilirubin Urine: NEGATIVE
Glucose, UA: NEGATIVE mg/dL
Hgb urine dipstick: NEGATIVE
Ketones, ur: NEGATIVE mg/dL
LEUKOCYTES UA: NEGATIVE
Nitrite: NEGATIVE
PROTEIN: NEGATIVE mg/dL
Specific Gravity, Urine: 1.002 — ABNORMAL LOW (ref 1.005–1.030)
pH: 7 (ref 5.0–8.0)

## 2016-07-16 LAB — CBC WITH DIFFERENTIAL/PLATELET
BASOS ABS: 0 10*3/uL (ref 0.0–0.1)
Basophils Relative: 0 %
EOS PCT: 1 %
Eosinophils Absolute: 0.2 10*3/uL (ref 0.0–0.7)
HEMATOCRIT: 35.1 % — AB (ref 36.0–46.0)
Hemoglobin: 12.4 g/dL (ref 12.0–15.0)
LYMPHS PCT: 25 %
Lymphs Abs: 2.9 10*3/uL (ref 0.7–4.0)
MCH: 30.3 pg (ref 26.0–34.0)
MCHC: 35.3 g/dL (ref 30.0–36.0)
MCV: 85.8 fL (ref 78.0–100.0)
Monocytes Absolute: 0.8 10*3/uL (ref 0.1–1.0)
Monocytes Relative: 7 %
NEUTROS ABS: 7.4 10*3/uL (ref 1.7–7.7)
Neutrophils Relative %: 67 %
PLATELETS: 242 10*3/uL (ref 150–400)
RBC: 4.09 MIL/uL (ref 3.87–5.11)
RDW: 12.6 % (ref 11.5–15.5)
WBC: 11.3 10*3/uL — AB (ref 4.0–10.5)

## 2016-07-16 LAB — COMPREHENSIVE METABOLIC PANEL
ALT: 31 U/L (ref 14–54)
AST: 30 U/L (ref 15–41)
Albumin: 3.8 g/dL (ref 3.5–5.0)
Alkaline Phosphatase: 91 U/L (ref 38–126)
Anion gap: 9 (ref 5–15)
BUN: 8 mg/dL (ref 6–20)
CO2: 18 mmol/L — ABNORMAL LOW (ref 22–32)
Calcium: 9.3 mg/dL (ref 8.9–10.3)
Chloride: 112 mmol/L — ABNORMAL HIGH (ref 101–111)
Creatinine, Ser: 0.6 mg/dL (ref 0.44–1.00)
GFR calc Af Amer: >60 mL/min (ref >60)
Glucose, Bld: 106 mg/dL — ABNORMAL HIGH (ref 65–99)
POTASSIUM: 3.2 mmol/L — AB (ref 3.5–5.1)
Sodium: 139 mmol/L (ref 135–145)
TOTAL PROTEIN: 7.4 g/dL (ref 6.5–8.1)
Total Bilirubin: 0.4 mg/dL (ref 0.3–1.2)

## 2016-07-16 MED ORDER — AMOXICILLIN 500 MG PO CAPS: 500.0000 mg | ORAL_CAPSULE | Freq: Three times a day (TID) | ORAL | 0 refills | Status: AC

## 2016-07-16 MED ORDER — POTASSIUM CHLORIDE CRYS ER 20 MEQ PO TBCR
40.0000 meq | EXTENDED_RELEASE_TABLET | Freq: Once | ORAL | Status: AC
  Administered 2016-07-16: 40 meq via ORAL
  Filled 2016-07-16: qty 2

## 2016-07-16 MED ORDER — AZITHROMYCIN 250 MG PO TABS: ORAL_TABLET | ORAL | 0 refills | Status: AC

## 2016-07-16 MED ORDER — OSELTAMIVIR PHOSPHATE 75 MG PO CAPS: 75.0000 mg | ORAL_CAPSULE | Freq: Two times a day (BID) | ORAL | 0 refills | Status: AC

## 2016-07-16 MED ORDER — SODIUM CHLORIDE 0.9 % IV BOLUS (SEPSIS)
500.0000 mL | Freq: Once | INTRAVENOUS | Status: AC
  Administered 2016-07-16: 500 mL via INTRAVENOUS

## 2016-07-16 MED ORDER — SODIUM CHLORIDE 0.9 % IV BOLUS (SEPSIS)
250.0000 mL | Freq: Once | INTRAVENOUS | Status: AC
  Administered 2016-07-16: 250 mL via INTRAVENOUS

## 2016-07-16 MED ORDER — SODIUM CHLORIDE 0.9 % IV BOLUS (SEPSIS)
1000.0000 mL | Freq: Once | INTRAVENOUS | Status: AC
  Administered 2016-07-16: 1000 mL via INTRAVENOUS

## 2016-07-16 MED ORDER — DM-GUAIFENESIN ER 30-600 MG PO TB12
1.0000 | ORAL_TABLET | Freq: Once | ORAL | Status: AC
Start: 2016-07-16 — End: 2016-07-16
  Administered 2016-07-16: 1 via ORAL
  Filled 2016-07-16: qty 1

## 2016-07-16 NOTE — Discharge Instructions (Signed)
Drink plenty of fluids. Take the medications as prescribed until gone. You can take mucinex DM OTC for cough. Monitor your self for fever. You can take ibuprofen 600 mg and/or acetaminophen 650 mg every 6 hrs as needed for fever or body aches. Recheck if you start having uncontrolled vomiting, struggle to breathe or seem worse instead of better.

## 2016-07-16 NOTE — ED Notes (Signed)
Patient returned from X-ray 

## 2016-07-16 NOTE — ED Triage Notes (Signed)
Per pt she has had cough, congestion, generalized body aches, fever and chills x 2 weeks.  Last night pt reports that she started to feel even worse.

## 2016-07-16 NOTE — ED Notes (Signed)
Patient transported to X-ray 

## 2016-07-16 NOTE — ED Provider Notes (Signed)
[facility]-EMERGENCY DEPT Provider Note   CSN: [id number] Arrival date & time: 07/15/16  2355  By signing my name below, I, Clovis Pu, attest that this documentation has been prepared under the direction and in the presence of Devoria Albe, MD  Electronically Signed: Clovis Pu, ED Scribe. 07/16/16. 1:03 AM.  Time seen 12:53 PM   History   Chief Complaint Chief Complaint  Patient presents with  . Flu Like Sx   The history is provided by the patient and a relative. No language interpreter was used.   HPI Comments:  [name] is a 41 y.o. female who presents to the Emergency Department complaining of persistent dry cough onset 2 weeks ago. She also reports minimal sore throat, mildly green minimal rhinorrhea, SOB onset 3 hours, body shaking onset 2 hours and several recent sick contacts. She notes a hx of similar symptoms 4 years ago when she was diagnosed with pneumonia. No alleviating factors noted. Pt denies nausea,  vomiting, diarrhea, fevers, daily medication use or any other associated symptom. Pt is a non-smoker. Pt did not get her flu shot this year.   History is translated by sister.   PCP: Nilda Simmer, MD  Past Medical History:  Diagnosis Date  . Abnormal Pap smear   . Migraine   . Migraines     Patient Active Problem List   Diagnosis Date Noted  . Active labor 07/22/2013  . 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 History of prior pregnancy with small for gestational age newborn 02/09/2013  . History of cervical LEEP biopsy affecting care of mother, antepartum 02/09/2013  . Migraine with aura 04/20/2011    Past Surgical History:  Procedure Laterality Date  . LEEP  2005  . NO PAST SURGERIES      OB History    Gravida Para Term Preterm AB Living   3 3 2 1  0 3   SAB TAB Ectopic Multiple Live Births   0 0 0 0 3       Home Medications    Prior to Admission medications   Medication Sig Start Date End Date Taking? Authorizing Provider    acetaminophen (TYLENOL) 500 MG tablet Take 1,000 mg by mouth every 6 (six) hours as needed for headache (headache).    Historical Provider, MD  amoxicillin (AMOXIL) 500 MG capsule Take 1 capsule (500 mg total) by mouth 3 (three) times daily. 07/16/16   Devoria Albe, MD  azithromycin (ZITHROMAX) 250 MG tablet Take 2 po the first day then once a day for the next 4 days. 07/16/16   Devoria Albe, MD  ibuprofen (ADVIL,MOTRIN) 600 MG tablet Take 1 tablet (600 mg total) by mouth every 6 (six) hours as needed. 08/11/14   Earley Favor, NP  oseltamivir (TAMIFLU) 75 MG capsule Take 1 capsule (75 mg total) by mouth every 12 (twelve) hours. 07/16/16   Devoria Albe, MD  promethazine (PHENERGAN) 25 MG tablet Take 1 tablet (25 mg total) by mouth every 8 (eight) hours as needed for nausea or vomiting. 03/24/16   Magdalene River, PA-C  SUMAtriptan (IMITREX) 50 MG tablet Take 1 tablet (50 mg total) by mouth every 2 (two) hours as needed for migraine. May repeat in 2 hours if headache persists or recurs. 03/24/16   Magdalene River, PA-C    Family History Family History  Problem Relation Age of Onset  . Hypertension Mother     Social History Social History  Substance Use Topics  . Smoking status: Never Smoker  .  Smokeless tobacco: Never Used  . Alcohol use No  unemployed   Allergies   Patient has no known allergies.   Review of Systems Review of Systems  Constitutional: Positive for chills. Negative for fever.  HENT: Positive for rhinorrhea and sore throat.   Respiratory: Positive for cough. Negative for wheezing.   All other systems reviewed and are negative.    Physical Exam Updated Vital Signs BP 110/69   Pulse 102   Temp 99.7 F (37.6 C) (Rectal)   Resp 16   Ht 5\' 4"  (1.626 m)   Wt 120 lb (54.4 kg)   LMP 07/08/2016 (Exact Date)   SpO2 98%   BMI 20.60 kg/m   Physical Exam  Constitutional: She is oriented to person, place, and time. She appears well-developed and well-nourished.  Non-toxic  appearance. She does not appear ill. She appears distressed.  Having chills during interview  HENT:  Head: Normocephalic and atraumatic.  Right Ear: External ear normal.  Left Ear: External ear normal.  Nose: Nose normal. No mucosal edema or rhinorrhea.  Mouth/Throat: Oropharynx is clear and moist and mucous membranes are normal. No dental abscesses or uvula swelling.  Eyes: Conjunctivae and EOM are normal. Pupils are equal, round, and reactive to light.  Neck: Normal range of motion and full passive range of motion without pain. Neck supple.  Cardiovascular: Normal rate, regular rhythm and normal heart sounds.  Exam reveals no gallop and no friction rub.   No murmur heard. Pulmonary/Chest: Breath sounds normal. Tachypnea noted. She has no wheezes. She has no rhonchi. She has no rales. She exhibits no tenderness and no crepitus.  Frequent coughing   Abdominal: Soft. Normal appearance and bowel sounds are normal. She exhibits no distension. There is no tenderness. There is no rebound and no guarding.  Musculoskeletal: Normal range of motion. She exhibits no edema or tenderness.  Moves all extremities well.   Neurological: She is alert and oriented to person, place, and time. She has normal strength. No cranial nerve deficit.  Skin: Skin is warm, dry and intact. No rash noted. No erythema. No pallor.  Feels hot  Psychiatric: Her speech is normal and behavior is normal. Her mood appears anxious.  Nursing note and vitals reviewed.    ED Treatments / Results  Labs (all labs ordered are listed, but only abnormal results are displayed) Results for orders placed or performed during the hospital encounter of 07/16/16  Comprehensive metabolic panel  Result Value Ref Range   Sodium 139 135 - 145 mmol/L   Potassium 3.2 (L) 3.5 - 5.1 mmol/L   Chloride 112 (H) 101 - 111 mmol/L   CO2 18 (L) 22 - 32 mmol/L   Glucose, Bld 106 (H) 65 - 99 mg/dL   BUN 8 6 - 20 mg/dL   Creatinine, Ser 1.610.60 0.44 -  1.00 mg/dL   Calcium 9.3 8.9 - 09.610.3 mg/dL   Total Protein 7.4 6.5 - 8.1 g/dL   Albumin 3.8 3.5 - 5.0 g/dL   AST 30 15 - 41 U/L   ALT 31 14 - 54 U/L   Alkaline Phosphatase 91 38 - 126 U/L   Total Bilirubin 0.4 0.3 - 1.2 mg/dL   GFR calc non Af Amer >60 >60 mL/min   GFR calc Af Amer >60 >60 mL/min   Anion gap 9 5 - 15  CBC WITH DIFFERENTIAL  Result Value Ref Range   WBC 11.3 (H) 4.0 - 10.5 K/uL   RBC 4.09 3.87 - 5.11  MIL/uL   Hemoglobin 12.4 12.0 - 15.0 g/dL   HCT 47.8 (L) 29.5 - 62.1 %   MCV 85.8 78.0 - 100.0 fL   MCH 30.3 26.0 - 34.0 pg   MCHC 35.3 30.0 - 36.0 g/dL   RDW 30.8 65.7 - 84.6 %   Platelets 242 150 - 400 K/uL   Neutrophils Relative % 67 %   Neutro Abs 7.4 1.7 - 7.7 K/uL   Lymphocytes Relative 25 %   Lymphs Abs 2.9 0.7 - 4.0 K/uL   Monocytes Relative 7 %   Monocytes Absolute 0.8 0.1 - 1.0 K/uL   Eosinophils Relative 1 %   Eosinophils Absolute 0.2 0.0 - 0.7 K/uL   Basophils Relative 0 %   Basophils Absolute 0.0 0.0 - 0.1 K/uL  Urinalysis, Routine w reflex microscopic  Result Value Ref Range   Color, Urine COLORLESS (A) YELLOW   APPearance CLEAR CLEAR   Specific Gravity, Urine 1.002 (L) 1.005 - 1.030   pH 7.0 5.0 - 8.0   Glucose, UA NEGATIVE NEGATIVE mg/dL   Hgb urine dipstick NEGATIVE NEGATIVE   Bilirubin Urine NEGATIVE NEGATIVE   Ketones, ur NEGATIVE NEGATIVE mg/dL   Protein, ur NEGATIVE NEGATIVE mg/dL   Nitrite NEGATIVE NEGATIVE   Leukocytes, UA NEGATIVE NEGATIVE  I-Stat CG4 Lactic Acid, ED  (not at  [facility])  Result Value Ref Range   Lactic Acid, Venous 1.71 0.5 - 1.9 mmol/L   Laboratory interpretation all normal except hypokalemia, leukocytosis without left or right shift, normal AG  EKG  EKG Interpretation  Date/Time:  Monday July 16 2016 02:00:50 EDT Ventricular Rate:  102 PR Interval:    QRS Duration: 87 QT Interval:  343 QTC Calculation: 447 R Axis:   84 Text Interpretation:  Sinus tachycardia Otherwise within normal limits No old tracing to  compare Confirmed by Ailen Strauch  MD-I, Cal Gindlesperger ([id number]) on 07/16/2016 2:17:45 AM       Radiology Dg Chest 2 View  Result Date: 07/16/2016 CLINICAL DATA:  Initial evaluation for acute cough for 2 weeks, chills. EXAM: CHEST  2 VIEW COMPARISON:  Prior radiograph from 04/12/2009. FINDINGS: The cardiac and mediastinal silhouettes are stable in size and contour, and remain within normal limits. The lungs are normally inflated. No airspace consolidation, pleural effusion, or pulmonary edema is identified. There is no pneumothorax. No acute osseous abnormality identified. IMPRESSION: No active cardiopulmonary disease. Electronically Signed   By: Rise Mu M.D.   On: 07/16/2016 00:59    Procedures Procedures (including critical care time)  Medications Ordered in ED Medications  dextromethorphan-guaiFENesin (MUCINEX DM) 30-600 MG per 12 hr tablet 1 tablet (not administered)  sodium chloride 0.9 % bolus 1,000 mL (0 mLs Intravenous Stopped 07/16/16 0235)    And  sodium chloride 0.9 % bolus 500 mL (0 mLs Intravenous Stopped 07/16/16 0220)    And  sodium chloride 0.9 % bolus 250 mL (250 mLs Intravenous New Bag/Given 07/16/16 0306)  potassium chloride SA (K-DUR,KLOR-CON) CR tablet 40 mEq (40 mEq Oral Given 07/16/16 0304)     Initial Impression / Assessment and Plan / ED Course  I have reviewed the triage vital signs and the nursing notes.  Pertinent labs & imaging results that were available during my care of the patient were reviewed by me and considered in my medical decision making (see chart for details).     DIAGNOSTIC STUDIES:  Oxygen Saturation is 100% on RA, normal by my interpretation.    COORDINATION OF CARE:  1:00 AM Discussed  her CXR results and treatment plan with pt at bedside and pt agreed to plan. Pt was given IV fluids, labs ordered.   3:55 AM Discussed lab work and imagine results with pt. Pt notes she feels better. She has not spiked a fever while in the ED but has had a  persistent low grade fever. We discussed would be unlikely for the flu the last 2 weeks so she most likely has bronchitis. However she may have an acute onset of some flu symptoms tonight. She was discharged home on Tamiflu and antibiotics for her bronchitis. She can take over-the-counter cough medications. She was advised to return if she got worse.  Final Clinical Impressions(s) / ED Diagnoses   Final diagnoses:  Influenza-like illness  Bronchitis    New Prescriptions New Prescriptions   AMOXICILLIN (AMOXIL) 500 MG CAPSULE    Take 1 capsule (500 mg total) by mouth 3 (three) times daily.   AZITHROMYCIN (ZITHROMAX) 250 MG TABLET    Take 2 po the first day then once a day for the next 4 days.   OSELTAMIVIR (TAMIFLU) 75 MG CAPSULE    Take 1 capsule (75 mg total) by mouth every 12 (twelve) hours.    Plan discharge  Devoria Albe, MD, FACEP  I personally performed the services described in this documentation, which was scribed in my presence. The recorded information has been reviewed and considered.  Devoria Albe, MD, Concha Pyo, MD 07/16/16 [phone]

## 2016-07-16 NOTE — ED Notes (Signed)
UNABLE TO COLLECT BLOOD SAMPLES 

## 2016-07-21 LAB — CULTURE, BLOOD (ROUTINE X 2)
CULTURE: NO GROWTH
Culture: NO GROWTH

## 2016-08-07 ENCOUNTER — Other Ambulatory Visit: Payer: Self-pay | Admitting: Physician Assistant

## 2016-08-07 DIAGNOSIS — G43919 Migraine, unspecified, intractable, without status migrainosus: Secondary | ICD-10-CM

## 2016-11-08 ENCOUNTER — Other Ambulatory Visit: Payer: Self-pay | Admitting: Physician Assistant

## 2016-11-08 DIAGNOSIS — G43919 Migraine, unspecified, intractable, without status migrainosus: Secondary | ICD-10-CM

## 2016-11-16 ENCOUNTER — Telehealth: Payer: Self-pay | Admitting: Physician Assistant

## 2016-11-16 NOTE — Telephone Encounter (Signed)
Please advise 

## 2016-11-16 NOTE — Telephone Encounter (Signed)
Pt is calling to see if she could get a refill for her migraine medication.  I did advise pt that she most likely needs to come in and be seen to get another refill.  Pt is a self pay pt with a balance and she is unable to afford the office visit.  She is wondering if Barnett AbuWiseman could refill her prescription until she is able to afford another visit.  Pt [facility] the [facility].

## 2016-11-20 ENCOUNTER — Other Ambulatory Visit: Payer: Self-pay | Admitting: Physician Assistant

## 2016-11-20 DIAGNOSIS — G43919 Migraine, unspecified, intractable, without status migrainosus: Secondary | ICD-10-CM

## 2016-11-20 MED ORDER — SUMATRIPTAN SUCCINATE 50 MG PO TABS: ORAL_TABLET | ORAL | 1 refills | Status: AC

## 2016-11-20 MED ORDER — SUMATRIPTAN SUCCINATE 50 MG PO TABS: ORAL_TABLET | ORAL | 1 refills | Status: DC

## 2016-11-20 NOTE — Telephone Encounter (Signed)
Please call pt and let her know I have refilled this but she will need office visit for future refills. Thanks!

## 2016-11-20 NOTE — Progress Notes (Signed)
  Please call pt and let her know that I have refilled her migraine medication but she needs office visit for further refills. It was sent to [facility] [address]. Thanks!

## 2016-11-20 NOTE — Progress Notes (Signed)
Pt notified and verbalized understanding.

## 2017-10-13 ENCOUNTER — Other Ambulatory Visit: Payer: Self-pay | Admitting: Physician Assistant

## 2017-10-13 DIAGNOSIS — G43919 Migraine, unspecified, intractable, without status migrainosus: Secondary | ICD-10-CM

## 2017-10-14 NOTE — Telephone Encounter (Signed)
Imitrex 50 mg refill request  Per notes and last refill she needs an appt. For further refills.    No future appts noted.  [facility] [address], [place] - [address]    PCP:  Dr. Katrinka BlazingSmith

## 2018-08-11 ENCOUNTER — Encounter (HOSPITAL_COMMUNITY): Payer: Self-pay

## 2018-08-11 ENCOUNTER — Emergency Department (HOSPITAL_COMMUNITY)
Admission: EM | Admit: 2018-08-11 | Discharge: 2018-08-11 | Disposition: A | Discharge status: Home / Self Care | Payer: Self-pay | Attending: Emergency Medicine | Admitting: Emergency Medicine

## 2018-08-11 ENCOUNTER — Other Ambulatory Visit: Payer: Self-pay

## 2018-08-11 DIAGNOSIS — Y998 Other external cause status: Secondary | ICD-10-CM | POA: Insufficient documentation

## 2018-08-11 DIAGNOSIS — Z23 Encounter for immunization: Secondary | ICD-10-CM | POA: Insufficient documentation

## 2018-08-11 DIAGNOSIS — S61212A Laceration without foreign body of right middle finger without damage to nail, initial encounter: Secondary | ICD-10-CM | POA: Insufficient documentation

## 2018-08-11 DIAGNOSIS — Y9389 Activity, other specified: Secondary | ICD-10-CM | POA: Insufficient documentation

## 2018-08-11 DIAGNOSIS — S61011A Laceration without foreign body of right thumb without damage to nail, initial encounter: Secondary | ICD-10-CM | POA: Insufficient documentation

## 2018-08-11 DIAGNOSIS — W25XXXA Contact with sharp glass, initial encounter: Secondary | ICD-10-CM | POA: Insufficient documentation

## 2018-08-11 DIAGNOSIS — Y92010 Kitchen of single-family (private) house as the place of occurrence of the external cause: Secondary | ICD-10-CM | POA: Insufficient documentation

## 2018-08-11 MED ORDER — LIDOCAINE HCL (PF) 1 % IJ SOLN
5.0000 mL | Freq: Once | INTRAMUSCULAR | Status: AC
  Administered 2018-08-11: 5 mL
  Filled 2018-08-11: qty 30

## 2018-08-11 MED ORDER — TETANUS-DIPHTH-ACELL PERTUSSIS 5-2.5-18.5 LF-MCG/0.5 IM SUSP
0.5000 mL | Freq: Once | INTRAMUSCULAR | Status: AC
  Administered 2018-08-11: 17:00:00 0.5 mL via INTRAMUSCULAR
  Filled 2018-08-11: qty 0.5

## 2018-08-11 NOTE — ED Provider Notes (Signed)
[facility]-EMERGENCY DEPT Provider Note   CSN: [id number] Arrival date & time: 08/11/18  1619    History   Chief Complaint Chief Complaint  Patient presents with  . Laceration    HPI [name] is a 43 y.o. female.     43 year old female presents with laceration to her right thumb and middle fingers.  Patient states that she was cleaning classes at home when 1 glass slipped and broke resulting in laceration to her fingers.  Bleeding is controlled prior to arrival, last tetanus unknown.  Patient is right-hand dominant.  No other injuries or concerns.     Past Medical History:  Diagnosis Date  . Abnormal Pap smear   . Migraine   . Migraines     Patient Active Problem List   Diagnosis Date Noted  . Active labor 07/22/2013  . 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 History of prior pregnancy with small for gestational age newborn 02/09/2013  . History of cervical LEEP biopsy affecting care of mother, antepartum 02/09/2013  . Migraine with aura 04/20/2011    Past Surgical History:  Procedure Laterality Date  . LEEP  2005  . NO PAST SURGERIES       OB History    Gravida  3   Para  3   Term  2   Preterm  1   AB  0   Living  3     SAB  0   TAB  0   Ectopic  0   Multiple  0   Live Births  3            Home Medications    Prior to Admission medications   Medication Sig Start Date End Date Taking? Authorizing Provider  acetaminophen (TYLENOL) 500 MG tablet Take 1,000 mg by mouth every 6 (six) hours as needed for headache (headache).    [provider]  amoxicillin (AMOXIL) 500 MG capsule Take 1 capsule (500 mg total) by mouth 3 (three) times daily. 07/16/16   Devoria Albe, MD  azithromycin (ZITHROMAX) 250 MG tablet Take 2 po the first day then once a day for the next 4 days. 07/16/16   Devoria Albe, MD  ibuprofen (ADVIL,MOTRIN) 600 MG tablet Take 1 tablet (600 mg total) by mouth every 6 (six) hours as  needed. 08/11/14   Earley Favor, NP  oseltamivir (TAMIFLU) 75 MG capsule Take 1 capsule (75 mg total) by mouth every 12 (twelve) hours. 07/16/16   Devoria Albe, MD  promethazine (PHENERGAN) 25 MG tablet Take 1 tablet (25 mg total) by mouth every 8 (eight) hours as needed for nausea or vomiting. 03/24/16   Benjiman Core D, PA-C  SUMAtriptan (IMITREX) 50 MG tablet TAKE ONE TABLET BY MOUTH AT ONSET OF HEADACHE. MAY REPEAT IN 2 HOURS IF NEEDED. MAX 2 TABLETS IN 24 HOURS. 11/20/16   Magdalene River, PA-C    Family History Family History  Problem Relation Age of Onset  . Hypertension Mother     Social History Social History   Tobacco Use  . Smoking status: Never Smoker  . Smokeless tobacco: Never Used  Substance Use Topics  . Alcohol use: No  . Drug use: No     Allergies   Patient has no known allergies.   Review of Systems Review of Systems  Constitutional: Negative for fever.  Musculoskeletal: Positive for myalgias. Negative for arthralgias and joint swelling.  Skin: Positive for wound.  Allergic/Immunologic: Negative for immunocompromised state.  Neurological: Negative for weakness and numbness.  Hematological: Does not bruise/bleed easily.  Psychiatric/Behavioral: Negative for self-injury.  All other systems reviewed and are negative.    Physical Exam Updated Vital Signs BP 111/68   Pulse 61   Temp 98.1 F (36.7 C) (Oral)   Resp 16   SpO2 100%   Physical Exam Vitals signs and nursing note reviewed.  Constitutional:      General: She is not in acute distress.    Appearance: She is well-developed. She is not diaphoretic.  HENT:     Head: Normocephalic and atraumatic.  Cardiovascular:     Pulses: Normal pulses.  Pulmonary:     Effort: Pulmonary effort is normal.  Musculoskeletal: Normal range of motion.        General: Tenderness and signs of injury present.     Right hand: She exhibits tenderness. She exhibits normal range of motion and normal capillary  refill. Normal sensation noted. Normal strength noted.       Hands:  Skin:    General: Skin is warm and dry.     Capillary Refill: Capillary refill takes less than 2 seconds.     Coloration: Skin is not pale.  Neurological:     Mental Status: She is alert and oriented to person, place, and time.  Psychiatric:        Behavior: Behavior normal.      ED Treatments / Results  Labs (all labs ordered are listed, but only abnormal results are displayed) Labs Reviewed - No data to display  EKG None  Radiology No results found.  Procedures .Marland KitchenLaceration Repair Date/Time: 08/11/2018 5:44 PM Performed by: Jeannie Fend, PA-C Authorized by: Jeannie Fend, PA-C   Consent:    Consent obtained:  Verbal   Consent given by:  Patient   Risks discussed:  Infection, need for additional repair, pain, poor cosmetic result and poor wound healing   Alternatives discussed:  No treatment and delayed treatment Universal protocol:    Procedure explained and questions answered to patient or proxy's satisfaction: yes     Relevant documents present and verified: yes     Test results available and properly labeled: yes     Imaging studies available: yes     Required blood products, implants, devices, and special equipment available: yes     Site/side marked: yes     Immediately prior to procedure, a time out was called: yes     Patient identity confirmed:  Verbally with patient Anesthesia (see MAR for exact dosages):    Anesthesia method:  Nerve block   Block location:  3rd MCP digital block   Block needle gauge:  25 G   Block anesthetic:  Lidocaine 1% w/o epi   Block technique:  Digital block   Block injection procedure:  Anatomic landmarks identified, anatomic landmarks palpated, introduced needle, negative aspiration for blood and incremental injection   Block outcome:  Anesthesia achieved Laceration details:    Location:  Finger   Finger location:  R long finger   Length (cm):  4    Depth (mm):  5 Repair type:    Repair type:  Simple Pre-procedure details:    Preparation:  Patient was prepped and draped in usual sterile fashion Exploration:    Hemostasis achieved with:  Tourniquet   Wound exploration: wound explored through full range of motion and entire depth of wound probed and visualized     Wound extent: no foreign bodies/material noted, no muscle damage noted  and no nerve damage noted   Treatment:    Area cleansed with:  Saline   Amount of cleaning:  Extensive   Irrigation solution:  Sterile saline Skin repair:    Repair method:  Sutures   Suture size:  4-0   Suture material:  Nylon   Suture technique:  Simple interrupted   Number of sutures:  7 Approximation:    Approximation:  Close Post-procedure details:    Dressing:  Bulky dressing   Patient tolerance of procedure:  Tolerated well, no immediate complications .Marland KitchenLaceration Repair Date/Time: 08/11/2018 5:45 PM Performed by: Jeannie Fend, PA-C Authorized by: Jeannie Fend, PA-C   Consent:    Consent obtained:  Verbal   Consent given by:  Patient   Risks discussed:  Infection, need for additional repair, pain, poor cosmetic result and poor wound healing   Alternatives discussed:  No treatment and delayed treatment Universal protocol:    Procedure explained and questions answered to patient or proxy's satisfaction: yes     Relevant documents present and verified: yes     Test results available and properly labeled: yes     Imaging studies available: yes     Required blood products, implants, devices, and special equipment available: yes     Site/side marked: yes     Immediately prior to procedure, a time out was called: yes     Patient identity confirmed:  Verbally with patient Anesthesia (see MAR for exact dosages):    Anesthesia method:  None Laceration details:    Location:  Finger   Finger location:  R thumb   Length (cm):  1.5   Depth (mm):  3 Repair type:    Repair type:  Simple  Pre-procedure details:    Preparation:  Patient was prepped and draped in usual sterile fashion Exploration:    Hemostasis achieved with:  Direct pressure   Wound exploration: wound explored through full range of motion and entire depth of wound probed and visualized     Wound extent: no foreign bodies/material noted and no muscle damage noted     Contaminated: no   Treatment:    Area cleansed with:  Saline   Amount of cleaning:  Standard   Irrigation solution:  Sterile saline Skin repair:    Repair method:  Tissue adhesive Approximation:    Approximation:  Close Post-procedure details:    Dressing:  Open (no dressing)   Patient tolerance of procedure:  Tolerated well, no immediate complications   (including critical care time)  Medications Ordered in ED Medications  lidocaine (PF) (XYLOCAINE) 1 % injection 5 mL (5 mLs Infiltration Given 08/11/18 1700)  Tdap (BOOSTRIX) injection 0.5 mL (0.5 mLs Intramuscular Given 08/11/18 1659)     Initial Impression / Assessment and Plan / ED Course  I have reviewed the triage vital signs and the nursing notes.  Pertinent labs & imaging results that were available during my care of the patient were reviewed by me and considered in my medical decision making (see chart for details).  Clinical Course as of Aug 11 1754  Mon Aug 11, 2018  3831 43 year old female with lacerations to the right thumb and middle fingers after accidentally cutting her fingers at home with glass today.  Sensation intact, FROM without weakness, cap refill normal. Wounds visualized through FROM, does not appear to involve tendons, no visible FB, discussed potential retained FB. Wounds closed, recommend recheck with UC for suture removal in 10 days.  [name] was  evaluated in Emergency Department on 08/11/2018 for the symptoms described in the history of present illness. She was evaluated in the context of the global COVID-19 pandemic, which necessitated  consideration that the patient might be at risk for infection with the SARS-CoV-2 virus that causes COVID-19. Institutional protocols and algorithms that pertain to the evaluation of patients at risk for COVID-19 are in a state of rapid change based on information released by regulatory bodies including the CDC and federal and state organizations. These policies and algorithms were followed during the patient's care in the ED.     [LM]  1756 Tdap updated today.    [LM]    Clinical Course User Index [LM] Jeannie Fend, PA-C   Final Clinical Impressions(s) / ED Diagnoses   Final diagnoses:  Laceration of right middle finger without foreign body without damage to nail, initial encounter  Laceration of right thumb without foreign body without damage to nail, initial encounter    ED Discharge Orders    None       Jeannie Fend, PA-C 08/11/18 1756    Charlynne Pander, MD 08/11/18 2256

## 2018-08-11 NOTE — ED Triage Notes (Addendum)
Laceration to middle finger on right hand. Patient applying pressure. Finger cut on glass from bottle. Patient states dishes fell out cabinet.   Patient spanish speaking only. Understands some english.    Ambulatory in triage.

## 2018-08-11 NOTE — Discharge Instructions (Addendum)
Go to Urgent Care for suture removal in 10 days.  Return to ER or go to Urgent Care for any concerns for infection at any time.

## 2020-02-25 ENCOUNTER — Other Ambulatory Visit: Payer: Self-pay

## 2020-02-25 ENCOUNTER — Emergency Department (HOSPITAL_COMMUNITY)
Admission: EM | Admit: 2020-02-25 | Discharge: 2020-02-25 | Disposition: A | Discharge status: Home / Self Care | Payer: Self-pay | Attending: Emergency Medicine | Admitting: Emergency Medicine

## 2020-02-25 ENCOUNTER — Encounter (HOSPITAL_COMMUNITY): Payer: Self-pay

## 2020-02-25 DIAGNOSIS — R519 Headache, unspecified: Secondary | ICD-10-CM | POA: Insufficient documentation

## 2020-02-25 DIAGNOSIS — I10 Essential (primary) hypertension: Secondary | ICD-10-CM | POA: Insufficient documentation

## 2020-02-25 DIAGNOSIS — G43919 Migraine, unspecified, intractable, without status migrainosus: Secondary | ICD-10-CM

## 2020-02-25 HISTORY — DX: Essential (primary) hypertension: I10

## 2020-02-25 MED ORDER — SUMATRIPTAN SUCCINATE 50 MG PO TABS: 50.0000 mg | ORAL_TABLET | ORAL | 0 refills | Status: AC | PRN

## 2020-02-25 MED ORDER — PROCHLORPERAZINE EDISYLATE 10 MG/2ML IJ SOLN
10.0000 mg | Freq: Once | INTRAMUSCULAR | Status: AC
  Administered 2020-02-25: 10 mg via INTRAVENOUS
  Filled 2020-02-25: qty 2

## 2020-02-25 MED ORDER — KETOROLAC TROMETHAMINE 30 MG/ML IJ SOLN
15.0000 mg | Freq: Once | INTRAMUSCULAR | Status: AC
  Administered 2020-02-25: 15 mg via INTRAVENOUS
  Filled 2020-02-25: qty 1

## 2020-02-25 MED ORDER — SODIUM CHLORIDE 0.9 % IV BOLUS
500.0000 mL | Freq: Once | INTRAVENOUS | Status: AC
  Administered 2020-02-25: 500 mL via INTRAVENOUS

## 2020-02-25 MED ORDER — DIPHENHYDRAMINE HCL 50 MG/ML IJ SOLN
25.0000 mg | Freq: Once | INTRAMUSCULAR | Status: AC
  Administered 2020-02-25: 25 mg via INTRAVENOUS
  Filled 2020-02-25: qty 1

## 2020-02-25 NOTE — ED Provider Notes (Signed)
[facility] Note   CSN: [id number] Arrival date & time: 02/25/20  1758     History Chief Complaint  Patient presents with  . Migraine    [name] is a 44 y.o. female.  HPI   Patient with significant medical history of hypertension and migraines presents to the emergency department with chief complaint of headaches.  Patient states she is having a severe headache that started about 3 days ago.  Patient states originally the headache was a 5 out of 10 but today it turned into a 10 out of 10 pain.  Patient states this feels like her typical migraine episodes, she complains of a sharp throbbing sensation in the front of her head.  She denies associated symptoms like change in vision, paresthesias or weakness in the upper lower extremities.  She does endorse nausea but denies any vomiting, she states this is normal for her.  She denies  recent head injuries, is not on anticoags, denies torticollis, is vaccine for meningitis.  Patient states she has been taking Tylenol and ibuprofen without any relief she used to take Imitrex but is currently out of it.  Patient denies fevers, chills, shortness of breath, chest pain, bowel pain, nausea, vomiting, diarrhea, pedal edema.  Past Medical History:  Diagnosis Date  . Abnormal Pap smear   . Hypertension   . Migraine   . Migraines     Patient Active Problem List   Diagnosis Date Noted  . Active labor 07/22/2013  . 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 History of prior pregnancy with small for gestational age newborn 02/09/2013  . History of cervical LEEP biopsy affecting care of mother, antepartum 02/09/2013  . Migraine with aura 04/20/2011    Past Surgical History:  Procedure Laterality Date  . LEEP  2005  . NO PAST SURGERIES       OB History    Gravida  3   Para  3   Term  2   Preterm  1   AB  0   Living  3     SAB  0   TAB  0   Ectopic  0    Multiple  0   Live Births  3           Family History  Problem Relation Age of Onset  . Hypertension Mother     Social History   Tobacco Use  . Smoking status: Never Smoker  . Smokeless tobacco: Never Used  Vaping Use  . Vaping Use: Never used  Substance Use Topics  . Alcohol use: No  . Drug use: No    Home Medications Prior to Admission medications   Medication Sig Start Date End Date Taking? Authorizing Provider  acetaminophen (TYLENOL) 500 MG tablet Take 1,000 mg by mouth every 6 (six) hours as needed for headache (headache).    [provider]  amoxicillin (AMOXIL) 500 MG capsule Take 1 capsule (500 mg total) by mouth 3 (three) times daily. 07/16/16   Devoria Albe, MD  azithromycin (ZITHROMAX) 250 MG tablet Take 2 po the first day then once a day for the next 4 days. 07/16/16   Devoria Albe, MD  ibuprofen (ADVIL,MOTRIN) 600 MG tablet Take 1 tablet (600 mg total) by mouth every 6 (six) hours as needed. 08/11/14   Earley Favor, NP  oseltamivir (TAMIFLU) 75 MG capsule Take 1 capsule (75 mg total) by mouth every 12 (twelve) hours. 07/16/16   Devoria Albe,  MD  promethazine (PHENERGAN) 25 MG tablet Take 1 tablet (25 mg total) by mouth every 8 (eight) hours as needed for nausea or vomiting. 03/24/16   Benjiman Core D, PA-C  SUMAtriptan (IMITREX) 50 MG tablet TAKE ONE TABLET BY MOUTH AT ONSET OF HEADACHE. MAY REPEAT IN 2 HOURS IF NEEDED. MAX 2 TABLETS IN 24 HOURS. 11/20/16   Benjiman Core D, PA-C  SUMAtriptan (IMITREX) 50 MG tablet Take 1 tablet (50 mg total) by mouth every 2 (two) hours as needed for migraine. May repeat in 2 hours if headache persists or recurs. 02/25/20   Carroll Sage, PA-C    Allergies    Patient has no known allergies.  Review of Systems   Review of Systems  Constitutional: Negative for chills and fever.  HENT: Negative for congestion, tinnitus, trouble swallowing and voice change.   Eyes: Negative for visual disturbance.  Respiratory:  Negative for cough and shortness of breath.   Cardiovascular: Negative for chest pain.  Gastrointestinal: Positive for nausea. Negative for abdominal pain, diarrhea and vomiting.  Genitourinary: Negative for enuresis, flank pain and frequency.  Musculoskeletal: Negative for back pain.  Skin: Negative for rash.  Neurological: Positive for headaches. Negative for dizziness.  Hematological: Does not bruise/bleed easily.    Physical Exam Updated Vital Signs BP 123/79 (BP Location: Left Arm)   Pulse 87   Temp 98.5 F (36.9 C) (Oral)   Resp 15   Ht 5\' 5"  (1.651 m)   Wt 55.8 kg   LMP 02/23/2020   SpO2 100%   BMI 20.47 kg/m   Physical Exam Vitals and nursing note reviewed.  Constitutional:      General: She is in acute distress.     Appearance: Normal appearance. She is not ill-appearing or diaphoretic.  HENT:     Head: Normocephalic and atraumatic.     Nose: No congestion or rhinorrhea.     Mouth/Throat:     Mouth: Mucous membranes are moist.     Pharynx: Oropharynx is clear.  Eyes:     General: No visual field deficit or scleral icterus.    Conjunctiva/sclera: Conjunctivae normal.     Pupils: Pupils are equal, round, and reactive to light.  Cardiovascular:     Rate and Rhythm: Normal rate and regular rhythm.     Pulses: Normal pulses.     Heart sounds: No murmur heard.  No friction rub. No gallop.   Pulmonary:     Effort: Pulmonary effort is normal. No respiratory distress.     Breath sounds: No wheezing, rhonchi or rales.  Abdominal:     General: There is no distension.     Palpations: Abdomen is soft.     Tenderness: There is no abdominal tenderness. There is no right CVA tenderness, left CVA tenderness or guarding.  Musculoskeletal:        General: No swelling or tenderness.     Right lower leg: No edema.     Left lower leg: No edema.  Skin:    General: Skin is warm and dry.     Capillary Refill: Capillary refill takes less than 2 seconds.     Findings: No rash.   Neurological:     General: No focal deficit present.     Mental Status: She is alert.     GCS: GCS eye subscore is 4. GCS verbal subscore is 5. GCS motor subscore is 6.     Cranial Nerves: Cranial nerves are intact. No cranial nerve deficit or  facial asymmetry.     Sensory: Sensation is intact. No sensory deficit.     Motor: Motor function is intact. No weakness or pronator drift.     Coordination: Coordination is intact. Romberg sign negative. Finger-Nose-Finger Test and Heel to [facility] Test normal.  Psychiatric:        Mood and Affect: Mood normal.     ED Results / Procedures / Treatments   Labs (all labs ordered are listed, but only abnormal results are displayed) Labs Reviewed - No data to display  EKG None  Radiology No results found.  Procedures Procedures (including critical care time)  Medications Ordered in ED Medications  prochlorperazine (COMPAZINE) injection 10 mg (10 mg Intravenous Given 02/25/20 1933)  sodium chloride 0.9 % bolus 500 mL (0 mLs Intravenous Stopped 02/25/20 2032)  ketorolac (TORADOL) 30 MG/ML injection 15 mg (15 mg Intravenous Given 02/25/20 1933)  diphenhydrAMINE (BENADRYL) injection 25 mg (25 mg Intravenous Given 02/25/20 1933)    ED Course  I have reviewed the triage vital signs and the nursing notes.  Pertinent labs & imaging results that were available during my care of the patient were reviewed by me and considered in my medical decision making (see chart for details).    MDM Rules/Calculators/A&P                          I have personally reviewed all imaging, labs and have interpreted them.  Patient presents with migraines.  She was alert, appeared to be in acute distress, vital signs reassuring.  Due to benign physical exam reassuring vital signs,  lab and imaging are not warranted at this time.  Will provide patient with Compazine, Benadryl, Toradol and IV fluids and reevaluate.  Patient was reevaluated and she states her headache  has completely resolved, she has no complaints at this time and states she is ready to go home.  She did ask for a refill of her Imitrex.  Will fill this for her and have her follow-up with her PCP.  Low suspicion for CVA or intracranial head bleed as patient denies recent head traumas, is not on anticoags, she endorses this feels like her typical migraines no neuro deficits noted on exam.  Low suspicion for meningitis as patient denies torticollis, she is fully vaccinated.  Low suspicion for hypertensive crisis or urgency as her vital signs are reassuring.  Low suspicion for systemic infection as patient is nontoxic-appearing, vital signs reassuring, no obvious source infection on exam.  I suspect patient suffering from migraine will provide her with Imitrex and have her follow-up with her PCP for further evaluation.  Vital signs have remained stable, no indication for hospital admission.  Patient discussed with attending and they agreed with assessment and plan.  Patient given at home care as well strict return precautions.  Patient verbalized that they understood agreed to said plan.  Final Clinical Impression(s) / ED Diagnoses Final diagnoses:  Bad headache    Rx / DC Orders ED Discharge Orders         Ordered    SUMAtriptan (IMITREX) 50 MG tablet  Every 2 hours PRN        02/25/20 2020           Carroll Sage, PA-C 02/25/20 2036    Melene Plan, DO 02/25/20 2101

## 2020-02-25 NOTE — Discharge Instructions (Addendum)
Seen here for a migraine.  Exam looks reassuring.  I prescribed you Imitrex please use as prescribed.  Please see your PCP for further evaluation management.  Come back to the emergency department if you develop chest pain, shortness of breath, severe abdominal pain, uncontrolled nausea, vomiting, diarrhea.

## 2020-02-25 NOTE — ED Triage Notes (Signed)
Patient c/o migraine x 3 days. Patient c/o light and sound sensitivity and nausea . Paitent has a history of migraine headaches  Patient states she has taken all of her Imitrex and continues to have a headache.

## 2021-06-21 ENCOUNTER — Other Ambulatory Visit: Payer: Self-pay | Admitting: Obstetrics & Gynecology

## 2021-06-21 DIAGNOSIS — Z1231 Encounter for screening mammogram for malignant neoplasm of breast: Secondary | ICD-10-CM

## 2021-07-11 ENCOUNTER — Inpatient Hospital Stay: Admission: RE | Admit: 2021-07-11 | Payer: Self-pay | Source: Ambulatory Visit

## 2022-03-13 ENCOUNTER — Ambulatory Visit
Admission: RE | Admit: 2022-03-13 | Discharge: 2022-03-13 | Disposition: A | Discharge status: Home / Self Care | Payer: No Typology Code available for payment source | Source: Ambulatory Visit | Attending: Nurse Practitioner | Admitting: Nurse Practitioner

## 2022-03-13 ENCOUNTER — Other Ambulatory Visit: Payer: Self-pay | Admitting: Nurse Practitioner

## 2022-03-13 DIAGNOSIS — R059 Cough, unspecified: Secondary | ICD-10-CM

## 2022-06-20 ENCOUNTER — Other Ambulatory Visit: Payer: Self-pay | Admitting: Obstetrics & Gynecology

## 2022-06-20 DIAGNOSIS — Z1231 Encounter for screening mammogram for malignant neoplasm of breast: Secondary | ICD-10-CM

## 2022-07-12 ENCOUNTER — Inpatient Hospital Stay: Admission: RE | Admit: 2022-07-12 | Payer: No Typology Code available for payment source | Source: Ambulatory Visit

## 2022-10-23 ENCOUNTER — Other Ambulatory Visit (INDEPENDENT_AMBULATORY_CARE_PROVIDER_SITE_OTHER): Payer: Self-pay

## 2022-10-23 ENCOUNTER — Ambulatory Visit (INDEPENDENT_AMBULATORY_CARE_PROVIDER_SITE_OTHER): Payer: Self-pay | Admitting: Physician Assistant

## 2022-10-23 DIAGNOSIS — M1712 Unilateral primary osteoarthritis, left knee: Secondary | ICD-10-CM

## 2022-10-23 DIAGNOSIS — M25562 Pain in left knee: Secondary | ICD-10-CM

## 2022-10-23 DIAGNOSIS — M170 Bilateral primary osteoarthritis of knee: Secondary | ICD-10-CM

## 2022-10-23 DIAGNOSIS — M25561 Pain in right knee: Secondary | ICD-10-CM

## 2022-10-23 MED ORDER — METHYLPREDNISOLONE ACETATE 40 MG/ML IJ SUSP
40.0000 mg | INTRAMUSCULAR | Status: AC | PRN
  Administered 2022-10-23: 40 mg via INTRA_ARTICULAR

## 2022-10-23 MED ORDER — BUPIVACAINE HCL 0.25 % IJ SOLN
2.0000 mL | INTRAMUSCULAR | Status: AC | PRN
  Administered 2022-10-23: 2 mL via INTRA_ARTICULAR

## 2022-10-23 MED ORDER — TRAMADOL HCL 50 MG PO TABS: 50.0000 mg | ORAL_TABLET | Freq: Two times a day (BID) | ORAL | 0 refills | Status: AC | PRN

## 2022-10-23 MED ORDER — LIDOCAINE HCL 1 % IJ SOLN
2.0000 mL | INTRAMUSCULAR | Status: AC | PRN
  Administered 2022-10-23: 2 mL

## 2022-10-23 NOTE — Addendum Note (Signed)
Addended by: Cristie Hem on: 10/23/2022 04:11 PM   Modules accepted: Orders

## 2022-10-23 NOTE — Progress Notes (Signed)
Office Visit Note   Patient: [name]           Date of Birth: 09/08/1975           MRN: [id number] Visit Date: 10/23/2022              Requested by: Ethelda Chick, MD [address] [place],  [place] [address] PCP: Ethelda Chick, MD   Assessment & Plan: Visit Diagnoses:  1. Primary osteoarthritis of both knees     Plan: Impression is bilateral knee osteoarthritis.  We have discussed various treatment options to include NSAIDs versus cortisone injection.  She is elected to proceed with cortisone injection to the left knee.  If her right knee continues to be symptomatic she may follow-up for cortisone injection.  This was all discussed through Spanish speaking interpreter.  Follow-Up Instructions: Return if symptoms worsen or fail to improve.   Orders:  Orders Placed This Encounter  Procedures   Large Joint Inj: L knee   XR KNEE 3 VIEW RIGHT   XR KNEE 3 VIEW LEFT   No orders of the defined types were placed in this encounter.     Procedures: Large Joint Inj: L knee on 10/23/2022 3:49 PM Indications: pain Details: 22 G needle, anterolateral approach Medications: 2 mL lidocaine 1 %; 2 mL bupivacaine 0.25 %; 40 mg methylPREDNISolone acetate 40 MG/ML      Clinical Data: No additional findings.   Subjective: Chief Complaint  Patient presents with   Right Knee - Pain   Left Knee - Pain    HPI patient is a very pleasant 47 year old Spanish-speaking female who is here today with interpreter.  She is here with bilateral knee pain left greater than right.  Her left knee pain started about a year ago and worsened about 4 months ago.  Right knee pain started about 3 to 4 days ago.  No known injury to either knee.  She does note she has a job where she stands on her feet 8 hours a day.  The pain she has is to the entire aspect worse going from seated to standing position as well as when she is turning over in bed.  She does not take medication for this.  No  previous cortisone injection to either knee.  Review of Systems as detailed in HPI.  All others reviewed and are negative.   Objective: Vital Signs: There were no vitals taken for this visit.  Physical Exam well-developed well-nourished female no acute distress.  Alert and oriented x 3.  Ortho Exam bilateral knee exam shows no effusion.  Range of motion 0 to 115 degrees.  Marked medial and lateral joint line tenderness.  No patellofemoral crepitus.  She does have slight tenderness to the left distal quad.  Full strength with straight leg raise testing.  She is neurovascularly intact distally.  Specialty Comments:  No specialty comments available.  Imaging: XR KNEE 3 VIEW RIGHT  Result Date: 10/23/2022 X-rays demonstrate mild degenerative changes medial and patellofemoral compartments.  Patella Baha.  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   History of prior pregnancy with small for gestational age newborn 02/09/2013   History of cervical LEEP biopsy affecting care of mother, antepartum 02/09/2013   Migraine with aura  04/20/2011   Past Medical History:  Diagnosis Date   Abnormal Pap smear    Hypertension    Migraine    Migraines     Family History  Problem Relation Age of Onset   Hypertension Mother     Past Surgical History:  Procedure Laterality Date   LEEP  2005   NO PAST SURGERIES     Social History   Occupational History   Not on file  Tobacco Use   Smoking status: Never   Smokeless tobacco: Never  Vaping Use   Vaping Use: Never used  Substance and Sexual Activity   Alcohol use: No   Drug use: No   Sexual activity: Yes    Birth control/protection: None

## 2022-11-20 ENCOUNTER — Ambulatory Visit: Payer: No Typology Code available for payment source | Admitting: Physician Assistant

## 2023-11-02 NOTE — Progress Notes (Signed)
 [facility] [address] [place] [address] Dept: [phone] Provider Name: Quita Juliene Aspen, PA-C   Final Diagnosis 1. Intractable migraine with aura without status migrainosus  metoclopramide  (REGLAN ) injection 10 mg   ondansetron  (ZOFRAN ) injection 4 mg   diphenhydrAMINE  (BENADRYL ) injection 25 mg   ketorolac  (TORADOL ) injection 15 mg      Triage Note Migraine (AURA MIGRAINE- STARTED LAST NIGHT. REPORTS NO RELIEF WITH IMATRIEX (11AM AND 1PM))  Vitals BP 124/65 (BP Location: Left arm, Patient Position: Sitting)   Pulse 88   Temp 97.4 F (36.3 C) (Tympanic)   Resp 14   Ht 1.651 m (5' 5)   Wt 59 kg (130 lb)   LMP 10/25/2023 (Exact Date)   SpO2 100%   BMI 21.63 kg/m   Pmhx Medical History[1] Pshx Surgical History[2]  SUBJECTIVE                                                                                          HPI [name] is a pleasant 48 y.o. female with pmhx recurrent migraine, presenting to the Urgent Care complaining of acute migraine.  Onset: last night Associated sxs: photophobia, phonophobia, nausea, auras,  Provocation: unknown Palliation: usually Imitrex ,  Quality: throbbing  Region: currently bilat frontal Radiation: none Severity: mod-sev Timing: constant Relevant neuro history: Sees PCP for migraine,  HA frequency: avg twice monthly Home tx has been: Imitrex , she took 2 today without relief, Context: has had some migraines equal in severity to current, when Imitrex  does not work a migraine cocktail injx does work, daughter present with Pt, daughter is driver, Pt has time to go directly home and sleep,   Denies fixed pupil, FND, AMS/confusion, seizure, trauma, known CO exposure, URI sxs, fever, nuchal pain, rigidity, rash, emesis, chest pain, blood thinners, elevated BP, positional, dizziness, worse in the morning,  pregnancy, edema, elevated BMI, sudden onset, peaked at onset, exertional, syncope, worst HA ever, focal temporal pain, jaw pain,   ROS A pertinent review of systems was obtained and was negative except as noted in the HPI and MDM.  Hx reviewed by provider in [facility], updated by staff as appropriate:  Allergies, Medications, PMHx, FHx, SocHx, SurgHx, and Problem list.   OBJECTIVE                                                                                            Vitals BP 124/65 (BP Location: Left arm, Patient Position: Sitting)   Pulse 88   Temp 97.4 F (36.3 C) (Tympanic)   Resp 14   Ht 1.651 m (5' 5)   Wt 59 kg (130 lb)   LMP 10/25/2023 (Exact Date)   SpO2 100%  BMI 21.63 kg/m   Exam General: alert, appears stated age, cooperative and no distress, noted discomfort of HA, sits in darkened room covering eyes, otherwise well appearing Head: normocephalic, without obvious abnormality, atraumatic Eyes: conjunctivae/corneas clear, PER, EOM's intact,  Nose:  Nares patent, no discharge,  Neck: supple, symmetrical,  Neuro: A&Ox3, CN 2-12 grossly intact, motor strength sensory and coordination grossly intact, gait steady and unassisted, speech clear and appropriate, no gross FND noted,  Lungs: Normal effort, no cough,  Heart: regular rate,  Skin: warm, dry, appropriate color, texture, and turgor, without rashes or lesions,  Labs No results found for this or any previous visit (from the past 24 hours).   Imaging Plain Films Radiologist interpretation:  NA  Bedside ultrasound NA  Procedure NA   ASSESSMENT & ORDERS                                                                    Ashaya was seen today for migraine.  Diagnoses and all orders for this visit:  Intractable migraine with aura without status migrainosus -     metoclopramide  (REGLAN ) injection 10 mg -     ondansetron  (ZOFRAN ) injection 4 mg -     diphenhydrAMINE  (BENADRYL ) injection 25 mg -      ketorolac  (TORADOL ) injection 15 mg    MDM                                                                                                        This is a 48 y.o. female presenting with acute migraine intractable, onset last night, similar to several prior more severe migraines, avg is twice monthly, Imitrex  usually resolves it, 2 doses today have not helped, and a new Dx as listed above.  Course of acute care: NA  DDx considered, acute glaucoma, cervical/vertebral artery dissection, CO poisoning, Encephalitis, HTN encephalopathy, Meningitis, Mass, Preeclampsia, IIH, SAH, GCA, Traumatic ICH, Cluster, Migraine, Sinusitis, Tension,   Impression Presentation most consistent with: Migraine, similar onset and presentation to all prior, only more severe;  Not consistent with and thus low probability for remaining DDx as detailed in above H&P;  No eye pain, vision loss, or injected eyes, doubt glaucoma Not even minor traumatic MOI, no sudden focal neck pain, or FND, doubt cerv/vert dissection No home/work contacts also with HA, no emesis, weakness, or known CO exposure,  Afebrile without AMS or seizures, doubt encephalitis,  No vision loss, no AMS, dBP <120, doubt HTN encephalitis Afebrile without meningeal signs, or rash, doubt meningitis,  Sxs not positional, worse in morning, no emesis, seizures, or FND, doubt mass Not within range of 20w preg to 6w post partum, BP not elevated, no edema, doubt preeclampsia,  No obesity, not relatively young, doubt IIH Not sudden onset, peaked at onset, exertional, worse HA ever, or syncope, doubt SAH  Not >55yo, no temporal artery ttp, no blurry of double vision, no jaw pain, doubt GCA Not anticoagulated, not elderly, denies EtOH involved, not even mild traumatic MOI, doubt traumatic ICH, Cluster HA unlikely as sxs not unilateral, sudden, or orbital, no tearing, pt not female,  Sinus HA unlikely as no current or recent URI sxs, no sinus  congestion Tension HA unlikely as sxs not band-like, no shoulder neck or scalp muscle tension, does not cause aura,   No further UC work up indicated at this time.    Overall,  I do not feel findings are concerning for a condition requiring Emergent higher level of care or further Urgent medical work up today, unless onset of new or worsening sxs.  Thus, as discussed with Pt, I do feel it is safe at this time for Pt to d/c to home, to continue symptom control, management, and watchful waiting, (as long as Pt remains stable and sxs tolerable and improving as per prognosis discussed), and to seek further evaluation in the Out-Pt setting as needed, or per stated Plan.  Plan As per Pt instructions, Orders, and: -go straight home, sleep off the meds -hydrate well, decrease life stressors, sleep well, eat right, avoid sugar, exercise regularly,  -f/u PCP/Neurology if more frequent or not responding to meds  Shared decision making: NA Pt informed refusal: NA  At Discharge Pt well enough appearing and stable at this time to remain out-Pt, vital signs within acceptable range for dx and sxs, ready/safe for d/c.   Any abnormal vitals noted and considered. If performed, any lab or imaging results were reviewed and incorporated into the decision making process. Any pending results will be interpreted, Pt contacted and treated, and disposition updated, as appropriate.  No orders of the defined types were placed in this encounter.   Orders Placed This Encounter  Medications  . metoclopramide  (REGLAN ) injection 10 mg  . ondansetron  (ZOFRAN ) injection 4 mg  . diphenhydrAMINE  (BENADRYL ) injection 25 mg  . ketorolac  (TORADOL ) injection 15 mg    Disposition Urgent Care Follow Up: Urgent Care Follow Up: Home Care  Patient has been instructed on any applicable Rx/OTC medications, dosages, side effects, restrictions, and possible interactions as associated with each diagnosis in Assessment/Plan  above.  Patient education given on diagnosis, pathophysiology, treatment, and supportive care where needed.  Reviewed strict return precautions. Instructed on Red flags associated with current and potential diagnoses as well as close follow up action plan if they develop. General f/u plan: - If symptoms don't improve or are recurrent, RTC or f/u PCP. - If symptoms severe or quickly worsening, call 911 or f/u ED.  Patient expressed understanding and agreement with work up, assessment, and plan.  No barriers to adherence perceived by myself.  No AVS printed IF Pt opted to receive AVS online via [facility] app and/or [email or web address]    Electronically signed by Quita Juliene Aspen  Sat 11/02/2023 5:33 PM      [1] History reviewed. No pertinent past medical history. [2] History reviewed. No pertinent surgical history.

## 2023-11-11 ENCOUNTER — Emergency Department (HOSPITAL_COMMUNITY)
Admission: EM | Admit: 2023-11-11 | Discharge: 2023-11-11 | Disposition: A | Discharge status: Home / Self Care | Attending: Emergency Medicine | Admitting: Emergency Medicine

## 2023-11-11 ENCOUNTER — Other Ambulatory Visit: Payer: Self-pay

## 2023-11-11 ENCOUNTER — Encounter (HOSPITAL_COMMUNITY): Payer: Self-pay

## 2023-11-11 ENCOUNTER — Emergency Department (HOSPITAL_COMMUNITY)

## 2023-11-11 DIAGNOSIS — M5441 Lumbago with sciatica, right side: Secondary | ICD-10-CM | POA: Insufficient documentation

## 2023-11-11 DIAGNOSIS — M5431 Sciatica, right side: Secondary | ICD-10-CM

## 2023-11-11 DIAGNOSIS — M5116 Intervertebral disc disorders with radiculopathy, lumbar region: Secondary | ICD-10-CM

## 2023-11-11 LAB — CBC
HCT: 37.7 % (ref 36.0–46.0)
Hemoglobin: 12.4 g/dL (ref 12.0–15.0)
MCH: 30.4 pg (ref 26.0–34.0)
MCHC: 32.9 g/dL (ref 30.0–36.0)
MCV: 92.4 fL (ref 80.0–100.0)
Platelets: 266 K/uL (ref 150–400)
RBC: 4.08 MIL/uL (ref 3.87–5.11)
RDW: 13 % (ref 11.5–15.5)
WBC: 7.4 K/uL (ref 4.0–10.5)
nRBC: 0 % (ref 0.0–0.2)

## 2023-11-11 LAB — URINALYSIS, ROUTINE W REFLEX MICROSCOPIC
Bilirubin Urine: NEGATIVE
Glucose, UA: NEGATIVE mg/dL
Hgb urine dipstick: NEGATIVE
Ketones, ur: NEGATIVE mg/dL
Leukocytes,Ua: NEGATIVE
Nitrite: NEGATIVE
Protein, ur: NEGATIVE mg/dL
Specific Gravity, Urine: 1.017 (ref 1.005–1.030)
pH: 8 (ref 5.0–8.0)

## 2023-11-11 LAB — COMPREHENSIVE METABOLIC PANEL WITH GFR
ALT: 25 U/L (ref 0–44)
AST: 22 U/L (ref 15–41)
Albumin: 3.4 g/dL — ABNORMAL LOW (ref 3.5–5.0)
Alkaline Phosphatase: 81 U/L (ref 38–126)
Anion gap: 10 (ref 5–15)
BUN: 10 mg/dL (ref 6–20)
CO2: 25 mmol/L (ref 22–32)
Calcium: 8.6 mg/dL — ABNORMAL LOW (ref 8.9–10.3)
Chloride: 107 mmol/L (ref 98–111)
Creatinine, Ser: 0.68 mg/dL (ref 0.44–1.00)
GFR, Estimated: >60 mL/min (ref >60)
Glucose, Bld: 95 mg/dL (ref 70–99)
Potassium: 3.8 mmol/L (ref 3.5–5.1)
Sodium: 142 mmol/L (ref 135–145)
Total Bilirubin: 0.5 mg/dL (ref 0.0–1.2)
Total Protein: 6.4 g/dL — ABNORMAL LOW (ref 6.5–8.1)

## 2023-11-11 LAB — PREGNANCY, URINE: Preg Test, Ur: NEGATIVE

## 2023-11-11 MED ORDER — KETOROLAC TROMETHAMINE 15 MG/ML IJ SOLN
15.0000 mg | Freq: Once | INTRAMUSCULAR | Status: AC
  Administered 2023-11-11: 15 mg via INTRAVENOUS
  Filled 2023-11-11: qty 1

## 2023-11-11 MED ORDER — GADOBUTROL 1 MMOL/ML IV SOLN
6.0000 mL | Freq: Once | INTRAVENOUS | Status: AC | PRN
  Administered 2023-11-11: 6 mL via INTRAVENOUS

## 2023-11-11 MED ORDER — MELOXICAM 7.5 MG PO TABS: 7.5000 mg | ORAL_TABLET | Freq: Every day | ORAL | 0 refills | Status: AC

## 2023-11-11 MED ORDER — GABAPENTIN 100 MG PO CAPS: 100.0000 mg | ORAL_CAPSULE | Freq: Three times a day (TID) | ORAL | 0 refills | Status: AC

## 2023-11-11 MED ORDER — OXYCODONE-ACETAMINOPHEN 5-325 MG PO TABS
1.0000 | ORAL_TABLET | Freq: Once | ORAL | Status: AC
  Administered 2023-11-11: 1 via ORAL
  Filled 2023-11-11: qty 1

## 2023-11-11 MED ORDER — DIAZEPAM 5 MG/ML IJ SOLN
2.5000 mg | Freq: Once | INTRAMUSCULAR | Status: AC
  Administered 2023-11-11: 2.5 mg via INTRAVENOUS
  Filled 2023-11-11: qty 2

## 2023-11-11 MED ORDER — ONDANSETRON 4 MG PO TBDP
4.0000 mg | ORAL_TABLET | Freq: Once | ORAL | Status: AC
  Administered 2023-11-11: 4 mg via ORAL
  Filled 2023-11-11: qty 1

## 2023-11-11 MED ORDER — OMEPRAZOLE 20 MG PO CPDR: 20.0000 mg | DELAYED_RELEASE_CAPSULE | Freq: Every day | ORAL | 0 refills | Status: AC

## 2023-11-11 MED ORDER — GABAPENTIN 100 MG PO CAPS
200.0000 mg | ORAL_CAPSULE | Freq: Once | ORAL | Status: DC
  Filled 2023-11-11: qty 2

## 2023-11-11 NOTE — ED Triage Notes (Addendum)
 Pt to er, pt states that she is here for her sciatica, states that she has back pain and leg pain.  States that she has had the pain for the past month.  Pt has brace in place.    Pt ambulatory to registration

## 2023-11-11 NOTE — ED Provider Triage Note (Signed)
 Emergency Medicine Provider Triage Evaluation Note  [name] , a 48 y.o. female  was evaluated in triage.  Pt complains of sciatica pain.  Reports radiating to right leg.  Pain ongoing for the last month.  Leg brace in place.  Endorses that she has been having some nausea, dizziness.  She denies any vomiting.  Reports balance issues, difficulty walking.  Review of Systems  Positive: Back pain, dizziness, nausea Negative:   Physical Exam  BP 115/69 (BP Location: Right Arm)   Pulse 77   Temp 97.9 F (36.6 C)   Resp 19   Ht 5' 5 (1.651 m)   Wt 59 kg   SpO2 100%   BMI 21.63 kg/m  Gen:   Awake, no distress   Resp:  Normal effort  MSK:   Decreased strength of right lower extremity 2/2 pain Other:  Severe ttp in midline lumbar spine  Medical Decision Making  Medically screening exam initiated at 1:12 PM.  Appropriate orders placed.  Kionna B Duncombe was informed that the remainder of the evaluation will be completed by another provider, this initial triage assessment does not replace that evaluation, and the importance of remaining in the ED until their evaluation is complete.  Workup initiated in triage    Rosan Sherlean DEL, [place] 11/11/23 1312

## 2023-11-11 NOTE — ED Provider Notes (Signed)
 [place] EMERGENCY DEPARTMENT AT [facility] Provider Note   CSN: [id number] Arrival date & time: 11/11/23  1216   Patient presents with: [employer] spanish interpretor used for interview.  [name] is a 48 y.o. female with past medical history of migraines who presents with 1 month of worsening lower back pain with RLE paresthesias and 2 days of urinary incontinence.  Patient states that she has had progressively worsening sciatica-like pain in her lower back and RLE over the last 3-4 weeks. Describes burning and ants crawling under my skin paresthesias following from her R buttock down the posterolateral right leg down to her foot. Denies any inciting traumas or falls prior to symptom onset.  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  She states that she went to an urgent care 1.5 weeks ago and they gave her baclofen and a Toradol  shot, which seemed to alleviate the pain a little bit.  Patient denies any falls or focal weakness, but does note that the pain makes it difficult to walk especially in the last week.  She denies any groin/saddle anesthesia, but does note that over the last few days she has had multiple episodes of urinary incontinence in which she is not aware she has to pee in the pee just comes out.  This has never happened to her before.  Denies bowel incontinence.  Denies dysuria otherwise.   Prior to Admission medications   Medication Sig Start Date End Date Taking? Authorizing Provider  gabapentin  (NEURONTIN ) 100 MG capsule Take 1 capsule (100 mg total) by mouth 3 (three) times daily. 11/11/23  Yes Raoul Rake, MD  meloxicam  (MOBIC ) 7.5 MG tablet Take 1 tablet (7.5 mg total) by mouth daily. 11/11/23  Yes Raoul Rake, MD  omeprazole  (PRILOSEC) 20 MG capsule Take 1 capsule (20 mg total) by mouth daily. 11/11/23  Yes Raoul Rake, MD  acetaminophen  (TYLENOL ) 500 MG tablet  Take 1,000 mg by mouth every 6 (six) hours as needed for headache (headache).    [provider]  amoxicillin  (AMOXIL ) 500 MG capsule Take 1 capsule (500 mg total) by mouth 3 (three) times daily. 07/16/16   Knapp, Iva, MD  azithromycin  (ZITHROMAX ) 250 MG tablet Take 2 po the first day then once a day for the next 4 days. 07/16/16   Knapp, Iva, MD  ibuprofen  (ADVIL ,MOTRIN ) 600 MG tablet Take 1 tablet (600 mg total) by mouth every 6 (six) hours as needed. 08/11/14   Terryl Kubas, NP  oseltamivir  (TAMIFLU ) 75 MG capsule Take 1 capsule (75 mg total) by mouth every 12 (twelve) hours. 07/16/16   Knapp, Iva, MD  promethazine  (PHENERGAN ) 25 MG tablet Take 1 tablet (25 mg total) by mouth every 8 (eight) hours as needed for nausea or vomiting. 03/24/16   Wiseman, Brittany D, PA-C  SUMAtriptan  (IMITREX ) 50 MG tablet TAKE ONE TABLET BY MOUTH AT ONSET OF HEADACHE. MAY REPEAT IN 2 HOURS IF NEEDED. MAX 2 TABLETS IN 24 HOURS. 11/20/16   Wiseman, Brittany D, PA-C  SUMAtriptan  (IMITREX ) 50 MG tablet Take 1 tablet (50 mg total) by mouth every 2 (two) hours as needed for migraine. May repeat in 2 hours if headache persists or recurs. 02/25/20   Waylan Elsie PARAS, PA-C  traMADol  (ULTRAM ) 50 MG tablet Take 1 tablet (50 mg total) by mouth every 12 (twelve) hours as needed. 10/23/22   Jule Ronal CROME, PA-C  Allergies: Patient has no known allergies.     Updated Vital Signs BP 134/82   Pulse 79   Temp 97.9 F (36.6 C)   Resp 16   Ht 5' 5 (1.651 m)   Wt 59 kg   SpO2 100%   BMI 21.63 kg/m   Physical Exam Vitals reviewed.  Constitutional:      General: She is not in acute distress.    Appearance: She is not toxic-appearing or diaphoretic.     Comments: Lying on left side with knees bent, adjusting to sit supine or on right leg appears notably painful to the patient  HENT:     Head: Normocephalic and atraumatic.     Nose: Nose normal. No rhinorrhea.     Mouth/Throat:     Mouth: Mucous membranes are  moist.     Pharynx: Oropharynx is clear.  Eyes:     General: No scleral icterus.    Extraocular Movements: Extraocular movements intact.     Pupils: Pupils are equal, round, and reactive to light.  Cardiovascular:     Rate and Rhythm: Normal rate and regular rhythm.     Pulses: Normal pulses.     Heart sounds: No murmur heard.    No gallop.  Pulmonary:     Effort: Pulmonary effort is normal. No respiratory distress.     Breath sounds: Normal breath sounds.  Abdominal:     General: Abdomen is flat.     Palpations: Abdomen is soft.     Tenderness: There is no abdominal tenderness. There is no right CVA tenderness, left CVA tenderness or guarding.  Genitourinary:    Comments: No saddle anesthesia Musculoskeletal:        General: No deformity.     Cervical back: Normal range of motion and neck supple. No rigidity or tenderness.     Lumbar back: Bony tenderness present. Positive right straight leg raise test. Negative left straight leg raise test.     Right hip: No bony tenderness. Normal range of motion.     Left hip: No bony tenderness. Normal range of motion.     Right lower leg: No edema.     Left lower leg: No edema.     Comments: Focal midline lower L-spine tenderness  Skin:    General: Skin is warm and dry.     Capillary Refill: Capillary refill takes less than 2 seconds.     Findings: No bruising or lesion.  Neurological:     Mental Status: She is alert and oriented to person, place, and time.     GCS: GCS eye subscore is 4. GCS verbal subscore is 5. GCS motor subscore is 6.     Cranial Nerves: No cranial nerve deficit.     Motor: No weakness or abnormal muscle tone.     Deep Tendon Reflexes: Babinski sign absent on the right side. Babinski sign absent on the left side.     Reflex Scores:      Patellar reflexes are 2+ on the right side and 2+ on the left side.    Comments: Slightly diminished sensation of the right lower leg compared to left, otherwise no focal sensory  deficit.  Gait limited by pain but appears overall grossly normal. Intact gluteal squeeze     (all labs ordered are listed, but only abnormal results are displayed) Labs Reviewed  COMPREHENSIVE METABOLIC PANEL WITH GFR - Abnormal; Notable for the following components:      Result Value  Calcium 8.6 (*)    Total Protein 6.4 (*)    Albumin 3.4 (*)    All other components within normal limits  URINALYSIS, ROUTINE W REFLEX MICROSCOPIC - Abnormal; Notable for the following components:   APPearance CLOUDY (*)    All other components within normal limits  CBC  PREGNANCY, URINE    EKG: EKG Interpretation Date/Time:  Monday November 11 2023 13:43:31 EDT Ventricular Rate:  76 PR Interval:  122 QRS Duration:  74 QT Interval:  378 QTC Calculation: 425 R Axis:   76  Text Interpretation: Normal sinus rhythm Normal ECG When compared with ECG of 16-Jul-2016 02:00, PREVIOUS ECG IS PRESENT No acute changes Confirmed by Charlyn Sora [phone]) on 11/11/2023 3:56:59 PM  Radiology: MR Lumbar Spine W Wo Contrast Result Date: 11/11/2023 CLINICAL DATA:  lower back pain, c/f cauda equina EXAM: MRI LUMBAR SPINE WITHOUT AND WITH CONTRAST TECHNIQUE: Multiplanar and multiecho pulse sequences of the lumbar spine were obtained without and with intravenous contrast. CONTRAST:  6mL GADAVIST  GADOBUTROL  1 MMOL/ML IV SOLN COMPARISON:  None Available. FINDINGS: Segmentation:  Normal. Alignment:  Normal. Vertebrae:  No fracture, evidence of discitis, or bone lesion. Conus medullaris and cauda equina: Conus extends to the upper body of L1 level. Conus and cauda equina appear normal. No abnormal enhancement of the spinal cord, conus medullaris or nerve roots. Paraspinal and other soft tissues: Normal. Disc levels: There is disc desiccation and mild right posterolateral disc bulging of L5-S1. There is no significant spinal canal or neural foraminal stenosis. There is no pins mint of exiting nerve roots. The spinal canal and  neural foramina are otherwise widely patent throughout the lumbar spine. IMPRESSION: 1. Mild degenerative disc bulging at L5-S1. No significant spinal canal or neural foraminal stenosis. Electronically Signed   By: Evalene Coho M.D.   On: 11/11/2023 19:25   MR THORACIC SPINE W WO CONTRAST Result Date: 11/11/2023 CLINICAL DATA:  lower back pain, c/f cauda equina EXAM: MRI THORACIC WITHOUT AND WITH CONTRAST TECHNIQUE: Multiplanar and multiecho pulse sequences of the thoracic spine were obtained without and with intravenous contrast. CONTRAST:  6mL GADAVIST  GADOBUTROL  1 MMOL/ML IV SOLN COMPARISON:  None Available. FINDINGS: Alignment:  Mild rightward curvature of the midthoracic spine. Vertebrae: Vertebral bodies maintain their height and demonstrate normal signal intensity. No lesions are present. Cord: Normal in morphology and signal intensity. No abnormal enhancement of the spinal cord or nerves. Paraspinal and other soft tissues: Normal. Disc levels: The disc spaces are well preserved and the spinal canal and neural foramina are widely patent. IMPRESSION: 1. Mild rightward curvature of the midthoracic spine. No evidence of spinal canal or neural foraminal stenosis. Electronically Signed   By: Evalene Coho M.D.   On: 11/11/2023 19:21   CT Lumbar Spine Wo Contrast Result Date: 11/11/2023 CLINICAL DATA:  Sciatica pain to right leg EXAM: CT LUMBAR SPINE WITHOUT CONTRAST TECHNIQUE: Multidetector CT imaging of the lumbar spine was performed without intravenous contrast administration. Multiplanar CT image reconstructions were also generated. RADIATION DOSE REDUCTION: This exam was performed according to the departmental dose-optimization program which includes automated exposure control, adjustment of the mA and/or kV according to patient size and/or use of iterative reconstruction technique. COMPARISON:  None Available. FINDINGS: Segmentation: 5 lumbar type vertebrae. Alignment: Normal. Vertebrae: No acute  fracture or focal pathologic process. Paraspinal and other soft tissues: No acute paravertebral or paraspinal soft tissue abnormality Disc levels: Patent disc spaces. No significant canal stenosis. Foramen are patent bilaterally. IMPRESSION: Negative CT  appearance of the lumbar spine. Electronically Signed   By: Luke Bun M.D.   On: 11/11/2023 16:03     Medications Ordered in the ED  oxyCODONE -acetaminophen  (PERCOCET/ROXICET) 5-325 MG per tablet 1 tablet (1 tablet Oral Given 11/11/23 1328)  ondansetron  (ZOFRAN -ODT) disintegrating tablet 4 mg (4 mg Oral Given 11/11/23 1329)  diazepam  (VALIUM ) injection 2.5 mg (2.5 mg Intravenous Given 11/11/23 1851)  gadobutrol  (GADAVIST ) 1 MMOL/ML injection 6 mL (6 mLs Intravenous Contrast Given 11/11/23 1815)  ketorolac  (TORADOL ) 15 MG/ML injection 15 mg (15 mg Intravenous Given 11/11/23 2006)    Clinical Course as of 11/12/23 1327  Mon Nov 11, 2023  1620 CT Lumbar Spine Wo Contrast Negative CT appearance of the lumbar spine. Given focal neuro deficits and new bladder incontinence, will get MRI T/L spine to further assess. [AD]  1658 Patient complaining of dizziness/room-spinning sensation, states it began yesterday and forgot to mention earlier. Given she is about to go to MRI, will give dose of valium . [AD]  1945 MR Lumbar Spine W Wo Contrast Mild degenerative disc bulging at L5-S1. No significant spinal canal or neural foraminal stenosis.   [AD]  1945 MR THORACIC SPINE W WO CONTRAST Mild rightward curvature of the midthoracic spine. No evidence of spinal canal or neural foraminal stenosis.   [AD]  Tue Nov 12, 2023  1326 CBC, CMP, UPT, and UA all unremarkable [AD]    Clinical Course User Index [AD] Raoul Rake, MD    Medical Decision Making Patient with the above history presenting with 1 month of progressively worsening lower back pain, RLE paresthesias, and now 2 days of bladder incontinence. 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 Denies nay prior history of back issues/sciatica.   Patient's overall presentation is most consistent with sciatica/lumbar radiculopathy, especially given the distribution of her paresthesias down the posterolateral R leg from buttock to foot. However, given the new development of multiple episodes of bladder incontinence over the last 2 days, increased concern for cauda equina syndrome, though does not have saddle anesthesia on exam. Patient has no history of cancer or B-symptoms otherwise, but also considered spinal mass/malignancy as etiology.  Will get CT L spine as ordered in triage, and added on MRI T/L spine given presence of urinary incontinence. Patient given percocet and zofran  in triage, added on toradol  and gabapentin  later in ED course with further improvement in pain. Imaging resulted as above in ED course with no significant acute findings aside from mild disc bulging at L5-S1. Discussed plan with patient for discharge with strict return precautions, prescription for gabapentin  and meloxicam  (+ omeprazole  while using meloxicam ) and very close PCP follow up for consideration of PT referral or need for spine specialist later if symptoms persist or worsen. Patient agreeable to discharge, ambulated with some pain but overall no major issues.  Amount and/or Complexity of Data Reviewed Radiology: ordered. Decision-making details documented in ED Course.  Risk Prescription drug management.     Final diagnoses:  Sciatica of right side  Lumbar disc herniation with radiculopathy    ED Discharge Orders          Ordered    gabapentin  (NEURONTIN ) 100 MG capsule  3 times daily        11/11/23 2114    meloxicam  (MOBIC ) 7.5 MG tablet  Daily        11/11/23 2114    omeprazole  (PRILOSEC) 20  MG capsule  Daily        11/11/23 2117               Raoul Rake, MD 11/12/23 1336     Charlyn Sora, MD 11/15/23 [phone]

## 2023-11-11 NOTE — Discharge Instructions (Addendum)
 Hoy lo atendieron por dolor de espalda. Durante su estancia, le monitorizamos sus constantes vitales, le realizamos un examen fsico y le realizamos imgenes de columna. Todo result tranquilizador y, por el momento, no hay indicacin para realizar ms pruebas ni intervenciones en urgencias.  Cosas que debe hacer: - Acuda a su mdico de cabecera en las prximas 1 o 2 semanas. - Tome gabapentina 100 mg tres veces al da y meloxicam  7,5 mg al [employer]. Tome tambin omeprazol junto con meloxicam  para evitar una lcera gstrica. Si toma meloxicam , no tome ibuprofeno ni otros AINE.  Regrese a urgencias si presenta sntomas nuevos o que empeoran, como incontinencia urinaria agravada, incapacidad para caminar, debilidad grave o cualquier otra afeccin mdica grave.

## 2023-11-11 NOTE — ED Notes (Signed)
 Post void residual resulted .

## 2023-11-20 ENCOUNTER — Other Ambulatory Visit (HOSPITAL_BASED_OUTPATIENT_CLINIC_OR_DEPARTMENT_OTHER): Payer: Self-pay | Admitting: Nurse Practitioner

## 2023-11-20 DIAGNOSIS — M5440 Lumbago with sciatica, unspecified side: Secondary | ICD-10-CM

## 2024-05-20 ENCOUNTER — Other Ambulatory Visit: Payer: Self-pay | Admitting: Obstetrics & Gynecology

## 2024-05-20 DIAGNOSIS — Z1231 Encounter for screening mammogram for malignant neoplasm of breast: Secondary | ICD-10-CM

## 2024-06-25 ENCOUNTER — Encounter
